# Patient Record
Sex: Female | Born: 1955
Health system: Southern US, Community
[De-identification: ages and names within clinical notes are randomized; demographics above are authoritative.]

## PROBLEM LIST (undated history)

## (undated) DIAGNOSIS — E559 Vitamin D deficiency, unspecified: Secondary | ICD-10-CM

## (undated) DIAGNOSIS — E669 Obesity, unspecified: Secondary | ICD-10-CM

## (undated) DIAGNOSIS — G4733 Obstructive sleep apnea (adult) (pediatric): Secondary | ICD-10-CM

## (undated) DIAGNOSIS — R55 Syncope and collapse: Secondary | ICD-10-CM

## (undated) DIAGNOSIS — R32 Unspecified urinary incontinence: Secondary | ICD-10-CM

## (undated) DIAGNOSIS — I471 Supraventricular tachycardia, unspecified: Secondary | ICD-10-CM

## (undated) DIAGNOSIS — I340 Nonrheumatic mitral (valve) insufficiency: Secondary | ICD-10-CM

## (undated) DIAGNOSIS — I10 Essential (primary) hypertension: Secondary | ICD-10-CM

## (undated) DIAGNOSIS — D649 Anemia, unspecified: Secondary | ICD-10-CM

## (undated) DIAGNOSIS — D509 Iron deficiency anemia, unspecified: Secondary | ICD-10-CM

## (undated) HISTORY — PX: TUBAL LIGATION: SHX77

## (undated) HISTORY — DX: Nonrheumatic mitral (valve) insufficiency: I34.0

## (undated) HISTORY — PX: COLONOSCOPY WITH PROPOFOL: SHX5780

## (undated) HISTORY — DX: Obstructive sleep apnea (adult) (pediatric): G47.33

## (undated) HISTORY — DX: Iron deficiency anemia, unspecified: D50.9

## (undated) HISTORY — DX: Syncope and collapse: R55

## (undated) HISTORY — DX: Supraventricular tachycardia, unspecified: I47.10

## (undated) HISTORY — DX: Unspecified urinary incontinence: R32

---

## 1999-06-26 ENCOUNTER — Encounter: Payer: Self-pay | Admitting: Gynecology

## 1999-06-26 ENCOUNTER — Encounter: Admission: RE | Admit: 1999-06-26 | Discharge: 1999-06-26 | Payer: Self-pay | Admitting: *Deleted

## 2001-01-06 ENCOUNTER — Encounter: Payer: Self-pay | Admitting: Gynecology

## 2001-01-06 ENCOUNTER — Encounter: Admission: RE | Admit: 2001-01-06 | Discharge: 2001-01-06 | Payer: Self-pay | Admitting: *Deleted

## 2004-06-16 ENCOUNTER — Ambulatory Visit: Payer: Self-pay

## 2005-11-14 ENCOUNTER — Encounter: Admission: RE | Admit: 2005-11-14 | Discharge: 2005-11-14 | Payer: Self-pay

## 2006-07-04 ENCOUNTER — Ambulatory Visit: Payer: Self-pay

## 2007-07-24 ENCOUNTER — Ambulatory Visit: Payer: Self-pay

## 2007-09-02 ENCOUNTER — Ambulatory Visit: Payer: Self-pay | Admitting: Gastroenterology

## 2008-08-26 ENCOUNTER — Ambulatory Visit: Payer: Self-pay

## 2009-09-06 ENCOUNTER — Ambulatory Visit: Payer: Self-pay

## 2009-10-05 ENCOUNTER — Ambulatory Visit: Payer: Self-pay

## 2009-11-04 ENCOUNTER — Ambulatory Visit: Payer: Self-pay

## 2010-06-23 ENCOUNTER — Ambulatory Visit: Payer: Self-pay

## 2011-03-07 ENCOUNTER — Ambulatory Visit: Payer: Self-pay | Admitting: Internal Medicine

## 2011-04-13 ENCOUNTER — Ambulatory Visit: Payer: Self-pay | Admitting: Internal Medicine

## 2012-03-18 ENCOUNTER — Ambulatory Visit: Payer: Self-pay | Admitting: Internal Medicine

## 2013-04-07 ENCOUNTER — Ambulatory Visit: Payer: Self-pay | Admitting: Internal Medicine

## 2013-04-17 ENCOUNTER — Ambulatory Visit: Payer: Self-pay | Admitting: Gastroenterology

## 2014-05-13 ENCOUNTER — Ambulatory Visit: Payer: Self-pay | Admitting: Internal Medicine

## 2015-05-11 ENCOUNTER — Other Ambulatory Visit: Payer: Self-pay | Admitting: Internal Medicine

## 2015-05-11 DIAGNOSIS — Z1231 Encounter for screening mammogram for malignant neoplasm of breast: Secondary | ICD-10-CM

## 2015-05-23 ENCOUNTER — Ambulatory Visit
Admission: RE | Admit: 2015-05-23 | Discharge: 2015-05-23 | Disposition: A | Discharge status: Home / Self Care | Payer: 59 | Source: Ambulatory Visit | Attending: Internal Medicine | Admitting: Internal Medicine

## 2015-05-23 DIAGNOSIS — Z1231 Encounter for screening mammogram for malignant neoplasm of breast: Secondary | ICD-10-CM

## 2016-05-02 ENCOUNTER — Other Ambulatory Visit: Payer: Self-pay | Admitting: Internal Medicine

## 2016-05-02 DIAGNOSIS — Z1231 Encounter for screening mammogram for malignant neoplasm of breast: Secondary | ICD-10-CM

## 2016-05-24 ENCOUNTER — Ambulatory Visit
Admission: RE | Admit: 2016-05-24 | Discharge: 2016-05-24 | Disposition: A | Discharge status: Home / Self Care | Payer: 59 | Source: Ambulatory Visit | Attending: Internal Medicine | Admitting: Internal Medicine

## 2016-05-24 DIAGNOSIS — Z1231 Encounter for screening mammogram for malignant neoplasm of breast: Secondary | ICD-10-CM | POA: Insufficient documentation

## 2016-08-08 DIAGNOSIS — R8290 Unspecified abnormal findings in urine: Secondary | ICD-10-CM | POA: Diagnosis not present

## 2016-08-08 DIAGNOSIS — D508 Other iron deficiency anemias: Secondary | ICD-10-CM | POA: Diagnosis not present

## 2016-08-08 DIAGNOSIS — Z0000 Encounter for general adult medical examination without abnormal findings: Secondary | ICD-10-CM | POA: Diagnosis not present

## 2016-09-18 DIAGNOSIS — E559 Vitamin D deficiency, unspecified: Secondary | ICD-10-CM | POA: Diagnosis not present

## 2016-09-18 DIAGNOSIS — Z79899 Other long term (current) drug therapy: Secondary | ICD-10-CM | POA: Diagnosis not present

## 2016-09-18 DIAGNOSIS — D508 Other iron deficiency anemias: Secondary | ICD-10-CM | POA: Diagnosis not present

## 2016-09-18 DIAGNOSIS — Z1329 Encounter for screening for other suspected endocrine disorder: Secondary | ICD-10-CM | POA: Diagnosis not present

## 2016-09-18 DIAGNOSIS — Z13220 Encounter for screening for lipoid disorders: Secondary | ICD-10-CM | POA: Diagnosis not present

## 2016-09-22 DIAGNOSIS — J069 Acute upper respiratory infection, unspecified: Secondary | ICD-10-CM | POA: Diagnosis not present

## 2016-10-05 ENCOUNTER — Ambulatory Visit: Payer: 59 | Admitting: Dietician

## 2016-10-22 ENCOUNTER — Encounter: Payer: 59 | Attending: Internal Medicine | Admitting: Dietician

## 2016-10-22 ENCOUNTER — Encounter: Payer: Self-pay | Admitting: Dietician

## 2016-10-22 VITALS — Ht 62.0 in | Wt 297.1 lb

## 2016-10-22 DIAGNOSIS — Z713 Dietary counseling and surveillance: Secondary | ICD-10-CM | POA: Insufficient documentation

## 2016-10-22 DIAGNOSIS — E669 Obesity, unspecified: Secondary | ICD-10-CM | POA: Diagnosis not present

## 2016-10-22 DIAGNOSIS — Z6841 Body Mass Index (BMI) 40.0 and over, adult: Secondary | ICD-10-CM | POA: Insufficient documentation

## 2016-10-22 DIAGNOSIS — IMO0001 Reserved for inherently not codable concepts without codable children: Secondary | ICD-10-CM

## 2016-10-22 DIAGNOSIS — D508 Other iron deficiency anemias: Secondary | ICD-10-CM | POA: Insufficient documentation

## 2016-10-22 DIAGNOSIS — K635 Polyp of colon: Secondary | ICD-10-CM | POA: Insufficient documentation

## 2016-10-22 DIAGNOSIS — E559 Vitamin D deficiency, unspecified: Secondary | ICD-10-CM | POA: Insufficient documentation

## 2016-10-22 NOTE — Progress Notes (Signed)
Medical Nutrition Therapy: Visit start time: 9390  end time: 1150  Assessment:  Diagnosis: obesity Past medical history: colon polyp, iron deficiency anemia, Vitamin D deficiency Psychosocial issues/ stress concerns: none Preferred learning method:  . Auditory  Current weight: 297.1lbs with shoes  Height: 5'2" Medications, supplements: reconciled list in medical record  Progress and evaluation: Patient reports recent weight loss of about 5lbs since working to increase general physical activity. Was participating in weight watchers at work, which helped for a while, but patient reports tiring of keeping up with points. She would like a sustainable plan for healthy lifestyle changes.    Physical activity: Increasing ADL's by taking stairs at work (4 flights) and parking farther away from buidlings, etc.   Dietary Intake:  Usual eating pattern includes 2 meals and 1-2 snacks per day. Dining out frequency: 9 meals per week.  Breakfast: activia yogurt; unsweet tea; sleeps after working 3rd shift Snack: none Lunch: 12-1pm sandwich or leftovers from previous evening meal.  Snack: none Supper: 7pm stir fry, meat loaf, fish-cod, flounder; Chinese fried rice with meat and veg.  Snack: sleeps after dinner about 2hrs, then goes to work. 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  Beverages: water, unsweet tea  Nutrition Care Education: Topics covered: weight management Basic nutrition: basic food groups, appropriate nutrient balance, appropriate meal and snack schedule, general nutrition guidelines    Weight control: benefits of weight control, role of stress hormones on weight; basic meal planning for about 1400kcal daily intake. Encouraged high fiber foods to promote fulness, and eating small portions of balanced nutrition at regular intervals. Discussed various quick and light options for meals and snacks.   Nutritional Diagnosis:  [place]-3.3 Overweight/obesity As  related to excess calories, stress, inactivity.  As evidenced by BMI 54, patient report.  Intervention: Instruction as noted above.   Set goals with direction from patient; she will first work to increase vegetable intake.   Commended her for finding ways to increase physical activity.   Education Materials given:  . Food lists/ Planning A Balanced Meal . Sample meal pattern/ menus . Goals/ instructions  Learner/ who was taught:  . Patient   Level of understanding: Marland Kitchen Verbalizes/ demonstrates competency  Demonstrated degree of understanding via:   Teach back Learning barriers: . None  Willingness to learn/ readiness for change: . Eager, change in progress  Monitoring and Evaluation:  Dietary intake, exercise, and body weight      follow up: 11/19/16

## 2016-10-22 NOTE — Patient Instructions (Signed)
   Eat a snack during work shift that includes some healthy carb and protein, such as a small sandwich, or fruit with nuts or lowfat cheese, veggies with cheese, bean and vegetable blend.   Have a yogurt or other small snack before going to sleep in the mornings.   Include generous portion of veggies and/or  with lunch and dinner meals to get full with low-calorie foods, to help with keeping blood pressure and cholesterol in good control.   Continue to build up physical activity in your day by adding opportunities for exercise both at work and home.

## 2016-11-19 ENCOUNTER — Ambulatory Visit: Payer: 59 | Admitting: Dietician

## 2016-12-14 ENCOUNTER — Encounter: Payer: Self-pay | Admitting: Dietician

## 2016-12-14 NOTE — Progress Notes (Signed)
Patient has not yet called back to reschedule her cancelled appointment. Sent discharge letter to MD.

## 2017-01-08 DIAGNOSIS — R300 Dysuria: Secondary | ICD-10-CM | POA: Diagnosis not present

## 2017-01-08 DIAGNOSIS — R8290 Unspecified abnormal findings in urine: Secondary | ICD-10-CM | POA: Diagnosis not present

## 2017-04-02 ENCOUNTER — Other Ambulatory Visit: Payer: Self-pay | Admitting: Obstetrics & Gynecology

## 2017-04-02 DIAGNOSIS — Z01419 Encounter for gynecological examination (general) (routine) without abnormal findings: Secondary | ICD-10-CM | POA: Diagnosis not present

## 2017-04-02 DIAGNOSIS — Z1211 Encounter for screening for malignant neoplasm of colon: Secondary | ICD-10-CM | POA: Diagnosis not present

## 2017-04-02 DIAGNOSIS — Z1231 Encounter for screening mammogram for malignant neoplasm of breast: Secondary | ICD-10-CM

## 2017-04-11 DIAGNOSIS — E559 Vitamin D deficiency, unspecified: Secondary | ICD-10-CM | POA: Diagnosis not present

## 2017-04-11 DIAGNOSIS — I10 Essential (primary) hypertension: Secondary | ICD-10-CM | POA: Diagnosis not present

## 2017-05-27 ENCOUNTER — Ambulatory Visit
Admission: RE | Admit: 2017-05-27 | Discharge: 2017-05-27 | Disposition: A | Discharge status: Home / Self Care | Payer: 59 | Source: Ambulatory Visit | Attending: Obstetrics & Gynecology | Admitting: Obstetrics & Gynecology

## 2017-05-27 DIAGNOSIS — Z1231 Encounter for screening mammogram for malignant neoplasm of breast: Secondary | ICD-10-CM

## 2018-04-25 DIAGNOSIS — E559 Vitamin D deficiency, unspecified: Secondary | ICD-10-CM | POA: Diagnosis not present

## 2018-04-25 DIAGNOSIS — Z79899 Other long term (current) drug therapy: Secondary | ICD-10-CM | POA: Diagnosis not present

## 2018-04-25 DIAGNOSIS — I10 Essential (primary) hypertension: Secondary | ICD-10-CM | POA: Diagnosis not present

## 2018-04-25 DIAGNOSIS — Z6841 Body Mass Index (BMI) 40.0 and over, adult: Secondary | ICD-10-CM | POA: Diagnosis not present

## 2018-05-13 DIAGNOSIS — Z23 Encounter for immunization: Secondary | ICD-10-CM | POA: Diagnosis not present

## 2018-05-13 DIAGNOSIS — Z1211 Encounter for screening for malignant neoplasm of colon: Secondary | ICD-10-CM | POA: Diagnosis not present

## 2018-05-13 DIAGNOSIS — Z8371 Family history of colonic polyps: Secondary | ICD-10-CM | POA: Diagnosis not present

## 2018-05-13 DIAGNOSIS — Z01818 Encounter for other preprocedural examination: Secondary | ICD-10-CM | POA: Diagnosis not present

## 2018-06-25 ENCOUNTER — Ambulatory Visit: Admit: 2018-06-25 | Payer: 59 | Admitting: Internal Medicine

## 2018-06-25 SURGERY — COLONOSCOPY WITH PROPOFOL
Anesthesia: General

## 2018-09-22 DIAGNOSIS — R399 Unspecified symptoms and signs involving the genitourinary system: Secondary | ICD-10-CM | POA: Diagnosis not present

## 2019-04-23 ENCOUNTER — Other Ambulatory Visit: Payer: Self-pay | Admitting: Internal Medicine

## 2019-04-23 DIAGNOSIS — M7989 Other specified soft tissue disorders: Secondary | ICD-10-CM

## 2019-04-30 ENCOUNTER — Other Ambulatory Visit: Payer: Self-pay

## 2019-04-30 ENCOUNTER — Ambulatory Visit
Admission: RE | Admit: 2019-04-30 | Discharge: 2019-04-30 | Disposition: A | Discharge status: Home / Self Care | Payer: 59 | Source: Ambulatory Visit | Attending: Internal Medicine | Admitting: Internal Medicine

## 2019-04-30 DIAGNOSIS — M7989 Other specified soft tissue disorders: Secondary | ICD-10-CM | POA: Insufficient documentation

## 2019-07-20 ENCOUNTER — Ambulatory Visit: Payer: Self-pay | Admitting: Urology

## 2019-09-07 ENCOUNTER — Ambulatory Visit: Payer: Self-pay | Admitting: Urology

## 2019-09-23 DIAGNOSIS — U071 COVID-19: Secondary | ICD-10-CM | POA: Insufficient documentation

## 2019-09-28 ENCOUNTER — Ambulatory Visit: Payer: Self-pay | Admitting: Urology

## 2019-10-19 ENCOUNTER — Ambulatory Visit: Payer: Self-pay | Admitting: Urology

## 2019-10-26 ENCOUNTER — Other Ambulatory Visit: Payer: Self-pay

## 2019-10-26 ENCOUNTER — Encounter: Payer: Self-pay | Admitting: Urology

## 2019-10-26 ENCOUNTER — Ambulatory Visit: Payer: 59 | Admitting: Urology

## 2019-10-26 VITALS — BP 132/68 | HR 88 | Ht 62.0 in | Wt 290.0 lb

## 2019-10-26 DIAGNOSIS — N3941 Urge incontinence: Secondary | ICD-10-CM | POA: Diagnosis not present

## 2019-10-26 DIAGNOSIS — R32 Unspecified urinary incontinence: Secondary | ICD-10-CM

## 2019-10-26 LAB — URINALYSIS, COMPLETE
Bilirubin, UA: NEGATIVE
Glucose, UA: NEGATIVE
Ketones, UA: NEGATIVE
Leukocytes,UA: NEGATIVE
Nitrite, UA: NEGATIVE
Protein,UA: NEGATIVE
RBC, UA: NEGATIVE
Specific Gravity, UA: 1.02 (ref 1.005–1.030)
Urobilinogen, Ur: 0.2 mg/dL (ref 0.2–1.0)
pH, UA: 5.5 (ref 5.0–7.5)

## 2019-10-26 LAB — BLADDER SCAN AMB NON-IMAGING: Scan Result: 6

## 2019-10-26 LAB — MICROSCOPIC EXAMINATION
Bacteria, UA: NONE SEEN
RBC, Urine: NONE SEEN /hpf (ref 0–2)

## 2019-10-26 MED ORDER — MIRABEGRON ER 50 MG PO TB24: 50.0000 mg | ORAL_TABLET | Freq: Every day | ORAL | 11 refills | Status: DC

## 2019-10-26 NOTE — Progress Notes (Signed)
10/26/2019 9:15 AM   Jennifer Mcbride 1956-05-01 [id number]  Referring provider: Idelle Crouch, MD [place] [facility] [place],  [place] [address]  Chief Complaint  Patient presents with  . Urinary Incontinence    HPI: I was consulted to assess the patient's urinary incontinence.  She has urge incontinence wearing 9 pad a day moderately wet to soaked.  She voids every 2 hours during the day and night.  She denies stress incontinence.  She has moderately severe bedwetting  She denies a history of previous GU surgery kidney stones bladder infections and she is never smoked.  No neurologic issues.  No hysterectomy.  No treatment.  Bowel movements normal   PMH: No past medical history on file.  Surgical History: No past surgical history on file.  Home Medications:  Allergies as of 10/26/2019   No Known Allergies     Medication List       Accurate as of October 26, 2019  9:15 AM. If you have any questions, ask your nurse or doctor.        bisoprolol-hydrochlorothiazide 5-6.25 MG tablet Commonly known as: ZIAC Take 1 tablet by mouth daily.   Vitamin D (Ergocalciferol) 1.25 MG (50000 UNIT) Caps capsule Commonly known as: DRISDOL TAKE 1 CAPSULE BY MOUTH  WEEKLY       Allergies: No Known Allergies  Family History: Family History  Problem Relation Age of Onset  . Breast cancer Neg Hx     Social History:  reports that she has never smoked. She has never used smokeless tobacco. She reports that she does not drink alcohol. No history on file for drug.  ROS:                                        Physical Exam: BP 132/68   Pulse 88   Ht 5\' 2"  (1.575 m)   Wt 131.5 kg   BMI 53.04 kg/m   Constitutional:  Alert and oriented, No acute distress. HEENT: [place] AT, moist mucus membranes.  Trachea midline, no masses. Cardiovascular: No clubbing, cyanosis, or edema. Respiratory: Normal respiratory effort, no increased work of  breathing. GI: Abdomen is soft, nontender, nondistended, no abdominal masses GU: Well supported bladder neck and no stress incontinence or prolapse Skin: No rashes, bruises or suspicious lesions. Lymph: No cervical or inguinal adenopathy. Neurologic: Grossly intact, no focal deficits, moving all 4 extremities. Psychiatric: Normal mood and affect.  Laboratory Data: No results found for: WBC, HGB, HCT, MCV, PLT  No results found for: CREATININE  No results found for: PSA  No results found for: TESTOSTERONE  No results found for: HGBA1C  Urinalysis No results found for: COLORURINE, APPEARANCEUR, LABSPEC, [place], GLUCOSEU, HGBUR, BILIRUBINUR, KETONESUR, PROTEINUR, UROBILINOGEN, NITRITE, LEUKOCYTESUR  Pertinent Imaging:   Assessment & Plan: Patient has high-volume urge incontinence and bedwetting.  She has significant frequency nocturia.  I decided not to order urodynamics based upon the clinical presentation.  I did not recommend cystoscopy yet.  I will treat her with medication and probably percutaneous tibial nerve stimulation.  Based upon body habitus and obesity Botox would not be ideal and I would have to check her lower back evaluating the role of sacral nerve stimulation.  If she becomes refractory to treatment cystoscopy is reasonable  Reassess in 5 weeks on Myrbetriq samples and prescription    1. Urinary incontinence, unspecified type  -  Urinalysis, Complete   No follow-ups on file.  Reece Packer, MD  [facility] [address], [place], [place] [address] [phone]

## 2019-10-28 LAB — CULTURE, URINE COMPREHENSIVE

## 2019-11-24 ENCOUNTER — Other Ambulatory Visit: Payer: Self-pay | Admitting: Internal Medicine

## 2019-11-24 DIAGNOSIS — Z1231 Encounter for screening mammogram for malignant neoplasm of breast: Secondary | ICD-10-CM

## 2019-11-24 DIAGNOSIS — E7800 Pure hypercholesterolemia, unspecified: Secondary | ICD-10-CM | POA: Insufficient documentation

## 2019-11-30 ENCOUNTER — Ambulatory Visit: Payer: Self-pay | Admitting: Urology

## 2019-12-01 ENCOUNTER — Encounter: Payer: Self-pay | Admitting: Urology

## 2019-12-21 ENCOUNTER — Other Ambulatory Visit: Payer: Self-pay

## 2019-12-21 DIAGNOSIS — R32 Unspecified urinary incontinence: Secondary | ICD-10-CM

## 2019-12-21 MED ORDER — MIRABEGRON ER 50 MG PO TB24: 50.0000 mg | ORAL_TABLET | Freq: Every day | ORAL | 3 refills | Status: DC

## 2020-01-12 ENCOUNTER — Other Ambulatory Visit: Payer: Self-pay

## 2020-01-12 DIAGNOSIS — R32 Unspecified urinary incontinence: Secondary | ICD-10-CM

## 2020-01-12 MED ORDER — MIRABEGRON ER 50 MG PO TB24: 50.0000 mg | ORAL_TABLET | Freq: Every day | ORAL | 3 refills | Status: DC

## 2020-03-24 ENCOUNTER — Other Ambulatory Visit
Admission: RE | Admit: 2020-03-24 | Discharge: 2020-03-24 | Disposition: A | Discharge status: Home / Self Care | Payer: 59 | Source: Ambulatory Visit | Attending: Gastroenterology | Admitting: Gastroenterology

## 2020-03-24 ENCOUNTER — Other Ambulatory Visit: Payer: Self-pay

## 2020-03-24 DIAGNOSIS — Z01812 Encounter for preprocedural laboratory examination: Secondary | ICD-10-CM | POA: Diagnosis not present

## 2020-03-24 DIAGNOSIS — Z20822 Contact with and (suspected) exposure to covid-19: Secondary | ICD-10-CM | POA: Diagnosis not present

## 2020-03-25 ENCOUNTER — Encounter: Payer: Self-pay | Admitting: *Deleted

## 2020-03-25 LAB — SARS CORONAVIRUS 2 (TAT 6-24 HRS): SARS Coronavirus 2: NEGATIVE

## 2020-03-28 ENCOUNTER — Ambulatory Visit: Payer: 59 | Admitting: Anesthesiology

## 2020-03-28 ENCOUNTER — Other Ambulatory Visit: Payer: Self-pay

## 2020-03-28 ENCOUNTER — Ambulatory Visit
Admission: RE | Admit: 2020-03-28 | Discharge: 2020-03-28 | Disposition: A | Discharge status: Home / Self Care | Payer: 59 | Attending: Gastroenterology | Admitting: Gastroenterology

## 2020-03-28 ENCOUNTER — Encounter
Admission: RE | Disposition: A | Discharge status: Home / Self Care | Payer: Self-pay | Source: Home / Self Care | Attending: Gastroenterology

## 2020-03-28 DIAGNOSIS — Z1211 Encounter for screening for malignant neoplasm of colon: Secondary | ICD-10-CM | POA: Diagnosis present

## 2020-03-28 DIAGNOSIS — K640 First degree hemorrhoids: Secondary | ICD-10-CM | POA: Insufficient documentation

## 2020-03-28 DIAGNOSIS — I10 Essential (primary) hypertension: Secondary | ICD-10-CM | POA: Diagnosis not present

## 2020-03-28 DIAGNOSIS — Z8371 Family history of colonic polyps: Secondary | ICD-10-CM | POA: Insufficient documentation

## 2020-03-28 DIAGNOSIS — E669 Obesity, unspecified: Secondary | ICD-10-CM | POA: Diagnosis not present

## 2020-03-28 DIAGNOSIS — E559 Vitamin D deficiency, unspecified: Secondary | ICD-10-CM | POA: Insufficient documentation

## 2020-03-28 DIAGNOSIS — Z79899 Other long term (current) drug therapy: Secondary | ICD-10-CM | POA: Insufficient documentation

## 2020-03-28 DIAGNOSIS — K5730 Diverticulosis of large intestine without perforation or abscess without bleeding: Secondary | ICD-10-CM | POA: Diagnosis not present

## 2020-03-28 DIAGNOSIS — Z6841 Body Mass Index (BMI) 40.0 and over, adult: Secondary | ICD-10-CM | POA: Insufficient documentation

## 2020-03-28 HISTORY — PX: COLONOSCOPY WITH PROPOFOL: SHX5780

## 2020-03-28 HISTORY — DX: Essential (primary) hypertension: I10

## 2020-03-28 HISTORY — DX: Vitamin D deficiency, unspecified: E55.9

## 2020-03-28 HISTORY — DX: Obesity, unspecified: E66.9

## 2020-03-28 HISTORY — DX: Anemia, unspecified: D64.9

## 2020-03-28 SURGERY — COLONOSCOPY WITH PROPOFOL
Anesthesia: General

## 2020-03-28 MED ORDER — PROPOFOL 500 MG/50ML IV EMUL
INTRAVENOUS | Status: DC | PRN
  Administered 2020-03-28: 125 ug/kg/min via INTRAVENOUS

## 2020-03-28 MED ORDER — EPHEDRINE 5 MG/ML INJ
INTRAVENOUS | Status: AC
  Filled 2020-03-28: qty 10

## 2020-03-28 MED ORDER — PROPOFOL 500 MG/50ML IV EMUL
INTRAVENOUS | Status: AC
  Filled 2020-03-28: qty 50

## 2020-03-28 MED ORDER — LIDOCAINE HCL (CARDIAC) PF 100 MG/5ML IV SOSY
PREFILLED_SYRINGE | INTRAVENOUS | Status: DC | PRN
  Administered 2020-03-28: 30 mg via INTRAVENOUS

## 2020-03-28 MED ORDER — GLYCOPYRROLATE 0.2 MG/ML IJ SOLN
INTRAMUSCULAR | Status: DC | PRN
  Administered 2020-03-28: .2 mg via INTRAVENOUS

## 2020-03-28 MED ORDER — GLYCOPYRROLATE 0.2 MG/ML IJ SOLN
INTRAMUSCULAR | Status: AC
  Filled 2020-03-28: qty 1

## 2020-03-28 MED ORDER — PROPOFOL 10 MG/ML IV BOLUS
INTRAVENOUS | Status: DC | PRN
  Administered 2020-03-28 (×3): 50 mg via INTRAVENOUS

## 2020-03-28 MED ORDER — SODIUM CHLORIDE 0.9 % IV SOLN
INTRAVENOUS | Status: DC
  Administered 2020-03-28: 20 mL/h via INTRAVENOUS

## 2020-03-28 MED ORDER — LIDOCAINE HCL (PF) 2 % IJ SOLN
INTRAMUSCULAR | Status: AC
  Filled 2020-03-28: qty 5

## 2020-03-28 MED ORDER — EPHEDRINE SULFATE 50 MG/ML IJ SOLN
INTRAMUSCULAR | Status: DC | PRN
  Administered 2020-03-28: 5 mg via INTRAVENOUS

## 2020-03-28 NOTE — H&P (Signed)
Outpatient short stay form Pre-procedure 03/28/2020 8:07 AM Jennifer Miyamoto MD, MPH  Primary Physician: Dr. Doy Hutching  Reason for visit:  Screening  History of present illness:   Ms. Jennifer Mcbride is a 64 y/o lady with reported history of poylps in mother and sister. She has not had polyps before that she knows of. No abdominal surgeries. No family history of GI malignancies. No blood thinners.    Current Facility-Administered Medications:  .  0.9 %  sodium chloride infusion, , Intravenous, Continuous, Tiki Tucciarone, Hilton Cork, MD, Last Rate: 20 mL/hr at 03/28/20 0748, 20 mL/hr at 03/28/20 0748  Medications Prior to Admission  Medication Sig Dispense Refill Last Dose  . bisoprolol-hydrochlorothiazide (ZIAC) 5-6.25 MG tablet Take 1 tablet by mouth daily.   03/27/2020 at Unknown time  . mirabegron ER (MYRBETRIQ) 50 MG TB24 tablet Take 1 tablet (50 mg total) by mouth daily. 90 tablet 3 03/27/2020 at Unknown time  . Multiple Vitamin (MULTIVITAMIN) tablet Take 1 tablet by mouth daily.   Past Week at Unknown time  . Vitamin D, Ergocalciferol, (DRISDOL) 50000 units CAPS capsule TAKE 1 CAPSULE BY MOUTH  WEEKLY   Past Week at Unknown time     No Known Allergies   Past Medical History:  Diagnosis Date  . Anemia    IDA  . Hypertension   . Obesity   . Vitamin D deficiency     Review of systems:  Otherwise negative.    Physical Exam  Gen: Alert, oriented. Appears stated age.  HEENT: [place]/AT. PERRLA. Lungs: No respiratory distress Abd: soft, benign, no masses. BS+ Ext: No edema.     Planned procedures: Proceed with colonoscopy. The patient understands the nature of the planned procedure, indications, risks, alternatives and potential complications including but not limited to bleeding, infection, perforation, damage to internal organs and possible oversedation/side effects from anesthesia. The patient agrees and gives consent to proceed.  Please refer to procedure notes for findings,  recommendations and patient disposition/instructions.     Jennifer Miyamoto MD, MPH Gastroenterology 03/28/2020  8:07 AM

## 2020-03-28 NOTE — Op Note (Signed)
[facility] Gastroenterology Patient Name: Jennifer Mcbride Procedure Date: 03/28/2020 8:54 AM MRN: [id number] Account #: [id number] Date of Birth: 09-03-1955 Admit Type: Outpatient Age: 64 Room: [facility] ENDO ROOM 2 Gender: Female Note Status: Finalized Procedure:             Colonoscopy Indications:           Family history of colonic polyps in a first-degree                         relative Providers:             Andrey Farmer MD, MD Medicines:             Monitored Anesthesia Care Complications:         No immediate complications. Procedure:             Pre-Anesthesia Assessment:                        - Prior to the procedure, a History and Physical was                         performed, and patient medications and allergies were                         reviewed. The patient is competent. The risks and                         benefits of the procedure and the sedation options and                         risks were discussed with the patient. All questions                         were answered and informed consent was obtained.                         Patient identification and proposed procedure were                         verified by the physician, the nurse, the anesthetist                         and the technician in the endoscopy suite. Mental                         Status Examination: alert and oriented. Airway                         Examination: normal oropharyngeal airway and neck                         mobility. Respiratory Examination: clear to                         auscultation. CV Examination: normal. Prophylactic                         Antibiotics: The patient does not require prophylactic  antibiotics. Prior Anticoagulants: The patient has                         taken no previous anticoagulant or antiplatelet                         agents. ASA Grade Assessment: III - A patient with                         severe  systemic disease. After reviewing the risks and                         benefits, the patient was deemed in satisfactory                         condition to undergo the procedure. The anesthesia                         plan was to use monitored anesthesia care (MAC).                         Immediately prior to administration of medications,                         the patient was re-assessed for adequacy to receive                         sedatives. The heart rate, respiratory rate, oxygen                         saturations, blood pressure, adequacy of pulmonary                         ventilation, and response to care were monitored                         throughout the procedure. The physical status of the                         patient was re-assessed after the procedure.                        After obtaining informed consent, the colonoscope was                         passed under direct vision. Throughout the procedure,                         the patient's blood pressure, pulse, and oxygen                         saturations were monitored continuously. The                         Colonoscope was introduced through the anus and                         advanced to the the cecum, identified by appendiceal  orifice and ileocecal valve. The colonoscopy was                         performed without difficulty. The patient tolerated                         the procedure well. The quality of the bowel                         preparation was good. Findings:      The perianal and digital rectal examinations were normal.      A few small-mouthed diverticula were found in the sigmoid colon,       descending colon, transverse colon and ascending colon.      Non-bleeding internal hemorrhoids were found during retroflexion. The       hemorrhoids were Grade I (internal hemorrhoids that do not prolapse).      The exam was otherwise without abnormality on direct and  retroflexion       views. Impression:            - Diverticulosis in the sigmoid colon, in the                         descending colon, in the transverse colon and in the                         ascending colon.                        - Non-bleeding internal hemorrhoids.                        - The examination was otherwise normal on direct and                         retroflexion views.                        - No specimens collected. Recommendation:        - Discharge patient to home.                        - Resume previous diet.                        - Continue present medications.                        - Repeat colonoscopy in 10 years for surveillance.                        - Return to referring physician as previously                         scheduled. Procedure Code(s):     --- Professional ---                        [phone], Colonoscopy, flexible; diagnostic, including                         collection of specimen(s) by brushing  or washing, when                         performed (separate procedure) Diagnosis Code(s):     --- Professional ---                        K64.0, First degree hemorrhoids                        Z83.71, Family history of colonic polyps                        K57.30, Diverticulosis of large intestine without                         perforation or abscess without bleeding CPT copyright 2019 American Medical Association. All rights reserved. The codes documented in this report are preliminary and upon coder review may  be revised to meet current compliance requirements. Andrey Farmer, MD Andrey Farmer MD, MD 03/28/2020 9:29:32 AM Number of Addenda: 0 Note Initiated On: 03/28/2020 8:54 AM Scope Withdrawal Time: 0 hours 9 minutes 31 seconds  Total Procedure Duration: 0 hours 14 minutes 36 seconds  Estimated Blood Loss:  Estimated blood loss: none.      [facility]

## 2020-03-28 NOTE — Anesthesia Postprocedure Evaluation (Signed)
Anesthesia Post Note  Patient: Jennifer Mcbride  Procedure(s) Performed: COLONOSCOPY WITH PROPOFOL (N/A )  Patient location during evaluation: PACU Anesthesia Type: General Level of consciousness: awake and alert Pain management: pain level controlled Vital Signs Assessment: post-procedure vital signs reviewed and stable Respiratory status: spontaneous breathing, nonlabored ventilation, respiratory function stable and patient connected to nasal cannula oxygen Cardiovascular status: blood pressure returned to baseline and stable Postop Assessment: no apparent nausea or vomiting Anesthetic complications: no   No complications documented.   Last Vitals:  Vitals:   03/28/20 0740 03/28/20 0930  BP: 135/82 (!) 98/52  Pulse: 67   Resp: 20   Temp: (!) 35.9 C (!) 35.8 C  SpO2: 99%     Last Pain:  Vitals:   03/28/20 0930  TempSrc: Temporal  PainSc: [name]

## 2020-03-28 NOTE — Interval H&P Note (Signed)
History and Physical Interval Note:  03/28/2020 8:59 AM  Jennifer Mcbride  has presented today for surgery, with the diagnosis of FH POLYPS.  The various methods of treatment have been discussed with the patient and family. After consideration of risks, benefits and other options for treatment, the patient has consented to  Procedure(s): COLONOSCOPY WITH PROPOFOL (N/A) as a surgical intervention.  The patient's history has been reviewed, patient examined, no change in status, stable for surgery.  I have reviewed the patient's chart and labs.  Questions were answered to the patient's satisfaction.     [name]  Ok to proceed with colonoscopy.

## 2020-03-28 NOTE — Progress Notes (Signed)
   03/28/20 0745  Clinical Encounter Type  Visited With Family  Visit Type Initial  Referral From Chaplain  Consult/Referral To Chaplain  While rounding SDS on 8/23, chaplain briefly visit with patient's mother. Chaplain told mother she was checking to make sure she was fine and to see if she had any questions. Patient's mother said she is fine and chaplain wihsed her well.

## 2020-03-28 NOTE — Interval H&P Note (Signed)
History and Physical Interval Note:  03/28/2020 8:09 AM  Jennifer Mcbride  has presented today for surgery, with the diagnosis of FH POLYPS.  The various methods of treatment have been discussed with the patient and family. After consideration of risks, benefits and other options for treatment, the patient has consented to  Procedure(s): COLONOSCOPY WITH PROPOFOL (N/A) as a surgical intervention.  The patient's history has been reviewed, patient examined, no change in status, stable for surgery.  I have reviewed the patient's chart and labs.  Questions were answered to the patient's satisfaction.     [name]  Ok to proceed with colonoscopy.

## 2020-03-28 NOTE — Transfer of Care (Signed)
Immediate Anesthesia Transfer of Care Note  Patient: Jennifer Mcbride  Procedure(s) Performed: COLONOSCOPY WITH PROPOFOL (N/A )  Patient Location: PACU and Endoscopy Unit  Anesthesia Type:MAC  Level of Consciousness: awake, alert  and oriented  Airway & Oxygen Therapy: Patient Spontanous Breathing  Post-op Assessment: Report given to RN and Post -op Vital signs reviewed and stable  Post vital signs: Reviewed and stable  Last Vitals:  Vitals Value Taken Time  BP 98/52 03/28/20 0931  Temp 35.8 C 03/28/20 0930  Pulse 76 03/28/20 0932  Resp 23 03/28/20 0932  SpO2 93 % 03/28/20 0932  Vitals shown include unvalidated device data.  Last Pain:  Vitals:   03/28/20 0930  TempSrc: Temporal  PainSc: Asleep         Complications: No complications documented.

## 2020-03-28 NOTE — Anesthesia Preprocedure Evaluation (Signed)
Anesthesia Evaluation  Patient identified by MRN, date of birth, ID band Patient awake    Reviewed: Allergy & Precautions, H&P , NPO status , Patient's Chart, lab work & pertinent test results, reviewed documented beta blocker date and time   Airway Mallampati: II   Neck ROM: full    Dental  (+) Poor Dentition   Pulmonary neg pulmonary ROS,    Pulmonary exam normal        Cardiovascular Exercise Tolerance: Good hypertension, On Medications negative cardio ROS Normal cardiovascular exam Rhythm:regular Rate:Normal     Neuro/Psych negative neurological ROS  negative psych ROS   GI/Hepatic negative GI ROS, Neg liver ROS,   Endo/Other  Morbid obesity  Renal/GU negative Renal ROS  negative genitourinary   Musculoskeletal   Abdominal   Peds  Hematology  (+) Blood dyscrasia, anemia ,   Anesthesia Other Findings Past Medical History: No date: Anemia     Comment:  IDA No date: Hypertension No date: Obesity No date: Vitamin D deficiency Past Surgical History: No date: COLONOSCOPY WITH PROPOFOL No date: TUBAL LIGATION BMI    Body Mass Index: 53.04 kg/m     Reproductive/Obstetrics negative OB ROS                             Anesthesia Physical Anesthesia Plan  ASA: III  Anesthesia Plan: General   Post-op Pain Management:    Induction:   PONV Risk Score and Plan:   Airway Management Planned:   Additional Equipment:   Intra-op Plan:   Post-operative Plan:   Informed Consent: I have reviewed the patients History and Physical, chart, labs and discussed the procedure including the risks, benefits and alternatives for the proposed anesthesia with the patient or authorized representative who has indicated his/her understanding and acceptance.     Dental Advisory Given  Plan Discussed with: CRNA  Anesthesia Plan Comments:         Anesthesia Quick Evaluation

## 2020-03-30 ENCOUNTER — Encounter: Payer: Self-pay | Admitting: Gastroenterology

## 2020-04-04 ENCOUNTER — Other Ambulatory Visit: Payer: Self-pay

## 2020-04-04 ENCOUNTER — Ambulatory Visit
Admission: RE | Admit: 2020-04-04 | Discharge: 2020-04-04 | Disposition: A | Discharge status: Home / Self Care | Payer: 59 | Source: Ambulatory Visit | Attending: Internal Medicine | Admitting: Internal Medicine

## 2020-04-04 DIAGNOSIS — Z1231 Encounter for screening mammogram for malignant neoplasm of breast: Secondary | ICD-10-CM | POA: Insufficient documentation

## 2021-01-12 ENCOUNTER — Ambulatory Visit: Payer: 59 | Admitting: Adult Health

## 2021-01-17 ENCOUNTER — Ambulatory Visit: Payer: 59 | Admitting: Adult Health

## 2021-01-17 ENCOUNTER — Other Ambulatory Visit: Payer: Self-pay

## 2021-01-17 ENCOUNTER — Telehealth: Payer: Self-pay

## 2021-01-17 ENCOUNTER — Encounter: Payer: Self-pay | Admitting: Adult Health

## 2021-01-17 DIAGNOSIS — N393 Stress incontinence (female) (male): Secondary | ICD-10-CM | POA: Diagnosis not present

## 2021-01-17 DIAGNOSIS — Z6841 Body Mass Index (BMI) 40.0 and over, adult: Secondary | ICD-10-CM

## 2021-01-17 DIAGNOSIS — D259 Leiomyoma of uterus, unspecified: Secondary | ICD-10-CM | POA: Insufficient documentation

## 2021-01-17 DIAGNOSIS — Z0189 Encounter for other specified special examinations: Secondary | ICD-10-CM | POA: Insufficient documentation

## 2021-01-17 DIAGNOSIS — M7989 Other specified soft tissue disorders: Secondary | ICD-10-CM

## 2021-01-17 DIAGNOSIS — Z8371 Family history of colonic polyps: Secondary | ICD-10-CM | POA: Insufficient documentation

## 2021-01-17 DIAGNOSIS — G4733 Obstructive sleep apnea (adult) (pediatric): Secondary | ICD-10-CM | POA: Diagnosis not present

## 2021-01-17 DIAGNOSIS — E559 Vitamin D deficiency, unspecified: Secondary | ICD-10-CM

## 2021-01-17 DIAGNOSIS — Z1389 Encounter for screening for other disorder: Secondary | ICD-10-CM | POA: Diagnosis not present

## 2021-01-17 DIAGNOSIS — Z23 Encounter for immunization: Secondary | ICD-10-CM

## 2021-01-17 DIAGNOSIS — R32 Unspecified urinary incontinence: Secondary | ICD-10-CM

## 2021-01-17 LAB — SPECIMEN STATUS REPORT

## 2021-01-17 MED ORDER — MIRABEGRON ER 50 MG PO TB24: 50.0000 mg | ORAL_TABLET | Freq: Every day | ORAL | 1 refills | Status: DC

## 2021-01-17 NOTE — Patient Instructions (Signed)
Bjorn Loser, MD  Urology Urinary incontinence, unspecified type +1 more  Dx  [place] 3.2 21 Google reviews Urologist in [place], [place] COVID-19 info: [email or web address] Get online care: [email or web address] Address: [address], [place], [place] [address] Hours:  Open now    Add full hours Phone: [phone]  Health Maintenance, Female Adopting a healthy lifestyle and getting preventive care are important in promoting health and wellness. Ask your health care provider about: The right schedule for you to have regular tests and exams. Things you can do on your own to prevent diseases and keep yourself healthy. What should I know about diet, weight, and exercise? Eat a healthy diet  Eat a diet that includes plenty of vegetables, fruits, low-fat dairy products, and lean protein. Do not eat a lot of foods that are high in solid fats, added sugars, or sodium.  Maintain a healthy weight Body mass index (BMI) is used to identify weight problems. It estimates body fat based on height and weight. Your health care provider can help determineyour BMI and help you achieve or maintain a healthy weight. Get regular exercise Get regular exercise. This is one of the most important things you can do for your health. Most adults should: Exercise for at least 150 minutes each week. The exercise should increase your heart rate and make you sweat (moderate-intensity exercise). Do strengthening exercises at least twice a week. This is in addition to the moderate-intensity exercise. Spend less time sitting. Even light physical activity can be beneficial. Watch cholesterol and blood lipids Have your blood tested for lipids and cholesterol at 64 years of age, then havethis test every 5 years. Have your cholesterol levels checked more often if: Your lipid or cholesterol levels are high. You are older than 64 years of age. You are at high risk for heart  disease. What should I know about cancer screening? Depending on your health history and family history, you may need to have cancer screening at various ages. This may include screening for: Breast cancer. Cervical cancer. Colorectal cancer. Skin cancer. Lung cancer. What should I know about heart disease, diabetes, and high blood pressure? Blood pressure and heart disease High blood pressure causes heart disease and increases the risk of stroke. This is more likely to develop in people who have high blood pressure readings, are of African descent, or are overweight. Have your blood pressure checked: Every 3-5 years if you are 44-19 years of age. Every year if you are 66 years old or older. Diabetes Have regular diabetes screenings. This checks your fasting blood sugar level. Have the screening done: Once every three years after age 71 if you are at a normal weight and have a low risk for diabetes. More often and at a younger age if you are overweight or have a high risk for diabetes. What should I know about preventing infection? Hepatitis B If you have a higher risk for hepatitis B, you should be screened for this virus. Talk with your health care provider to find out if you are at risk forhepatitis B infection. Hepatitis C Testing is recommended for: Everyone born from [birth year] through 1965. Anyone with known risk factors for hepatitis C. Sexually transmitted infections (STIs) Get screened for STIs, including gonorrhea and chlamydia, if: You are sexually active and are younger than 64 years of age. You are older than 64 years of age and your health care provider tells you that you are at risk for this type  of infection. Your sexual activity has changed since you were last screened, and you are at increased risk for chlamydia or gonorrhea. Ask your health care provider if you are at risk. Ask your health care provider about whether you are at high risk for HIV. Your health care provider  may recommend a prescription medicine to help prevent HIV infection. If you choose to take medicine to prevent HIV, you should first get tested for HIV. You should then be tested every 3 months for as long as you are taking the medicine. Pregnancy If you are about to stop having your period (premenopausal) and you may become pregnant, seek counseling before you get pregnant. Take 400 to 800 micrograms (mcg) of folic acid every day if you become pregnant. Ask for birth control (contraception) if you want to prevent pregnancy. Osteoporosis and menopause Osteoporosis is a disease in which the bones lose minerals and strength with aging. This can result in bone fractures. If you are 26 years old or older, or if you are at risk for osteoporosis and fractures, ask your health care provider if you should: Be screened for bone loss. Take a calcium or vitamin D supplement to lower your risk of fractures. Be given hormone replacement therapy (HRT) to treat symptoms of menopause. Follow these instructions at home: Lifestyle Do not use any products that contain nicotine or tobacco, such as cigarettes, e-cigarettes, and chewing tobacco. If you need help quitting, ask your health care provider. Do not use street drugs. Do not share needles. Ask your health care provider for help if you need support or information about quitting drugs. Alcohol use Do not drink alcohol if: Your health care provider tells you not to drink. You are pregnant, may be pregnant, or are planning to become pregnant. If you drink alcohol: Limit how much you use to 0-1 drink a day. Limit intake if you are breastfeeding. Be aware of how much alcohol is in your drink. In the [place] one drink equals one 12 oz bottle of beer (355 mL), one 5 oz glass of wine (148 mL), or one 1 oz glass of hard liquor (44 mL). General instructions Schedule regular health, dental, and eye exams. Stay current with your vaccines. Tell your health care  provider if: You often feel depressed. You have ever been abused or do not feel safe at home. Summary Adopting a healthy lifestyle and getting preventive care are important in promoting health and wellness. Follow your health care provider's instructions about healthy diet, exercising, and getting tested or screened for diseases. Follow your health care provider's instructions on monitoring your cholesterol and blood pressure. This information is not intended to replace advice given to you by your health care provider. Make sure you discuss any questions you have with your healthcare provider. Document Revised: 07/16/2018 Document Reviewed: 07/16/2018 Elsevier Patient Education  2022 [place].    Recombinant Zoster (Shingles) Vaccine: What You Need to Know 1. Why get vaccinated? Recombinant zoster (shingles) vaccine can prevent shingles. Shingles (also called herpes zoster, or just zoster) is a painful skin rash, usually with blisters. In addition to the rash, shingles can cause fever, headache, chills, or upset stomach. More rarely, shingles can lead to pneumonia, hearingproblems, blindness, brain inflammation (encephalitis), or death. The most common complication of shingles is long-term nerve pain called postherpetic neuralgia (PHN). PHN occurs in the areas where the shingles rash was, even after the rash clears up. It can last for months or years after therash goes away.  The pain from PHN can be severe and debilitating. About 10 to 18% of people who get shingles will experience PHN. The risk of PHN increases with age. An older adult with shingles is more likely to develop PHN and have longer lasting and more severe pain than a younger person withshingles. Shingles is caused by the varicella zoster virus, the same virus that causes chickenpox. After you have chickenpox, the virus stays in your body and can cause shingles later in life. Shingles cannot be passed from one person to another,  but the virus that causes shingles can spread and cause chickenpox insomeone who had never had chickenpox or received chickenpox vaccine. 2. Recombinant shingles vaccine Recombinant shingles vaccine provides strong protection against shingles. Bypreventing shingles, recombinant shingles vaccine also protects against PHN. Recombinant shingles vaccine is the preferred vaccine for the prevention of shingles. However, a different vaccine, live shingles vaccine, may be used in somecircumstances. The recombinant shingles vaccine is recommended for adults 50 years and older without serious immune problems. It is given as a two-dose series. This vaccine is also recommended for people who have already gotten another type of shingles vaccine, the live shingles vaccine. There is no live virus inthis vaccine. Shingles vaccine may be given at the same time as other vaccines. 3. Talk with your health care provider Tell your vaccine provider if the person getting the vaccine: Has had an allergic reaction after a previous dose of recombinant shingles vaccine, or has any severe, life-threatening allergies. Is pregnant or breastfeeding. Is currently experiencing an episode of shingles. In some cases, your health care provider may decide to postpone shinglesvaccination to a future visit. People with minor illnesses, such as a cold, may be vaccinated. People who are moderately or severely ill should usually wait until they recover beforegetting recombinant shingles vaccine. Your health care provider can give you more information. 4. Risks of a vaccine reaction A sore arm with mild or moderate pain is very common after recombinant shingles vaccine, affecting about 80% of vaccinated people. Redness and swelling can also happen at the site of the injection. Tiredness, muscle pain, headache, shivering, fever, stomach pain, and nausea happen after vaccination in more than half of people who receive recombinant shingles  vaccine. In clinical trials, about 1 out of 6 people who got recombinant zoster vaccine experienced side effects that prevented them from doing regular activities.Symptoms usually went away on their own in 2 to 3 days. You should still get the second dose of recombinant zoster vaccine even if youhad one of these reactions after the first dose. People sometimes faint after medical procedures, including vaccination. Tellyour provider if you feel dizzy or have vision changes or ringing in the ears. As with any medicine, there is a very remote chance of a vaccine causing asevere allergic reaction, other serious injury, or death. 5. What if there is a serious problem? An allergic reaction could occur after the vaccinated person leaves the clinic. If you see signs of a severe allergic reaction (hives, swelling of the face and throat, difficulty breathing, a fast heartbeat, dizziness, or weakness), call 9-1-1 and get the person to the nearest hospital. For other signs that concern you, call your health care provider. Adverse reactions should be reported to the Vaccine Adverse Event Reporting System (VAERS). Your health care provider will usually file this report, or you can do it yourself. Visit the VAERS website at www.vaers.SamedayNews.es or call (830)653-8064. VAERS is only for reporting reactions, and VAERS staff do not  give medical advice. 6. How can I learn more? Ask your health care provider. Call your local or state health department. Contact the Centers for Disease Control and Prevention (CDC): Call 715-659-9488 (1-800-CDC-INFO) or Visit CDC's website at http://hunter.com/ Vaccine Information Statement Recombinant Zoster Vaccine (06/04/2018) This information is not intended to replace advice given to you by your health care provider. Make sure you discuss any questions you have with your healthcare provider. Document Revised: 03/25/2020 Document Reviewed: 03/25/2020 Elsevier Patient Education   [place].

## 2021-01-17 NOTE — Telephone Encounter (Signed)
Trasity called from [facility] to report stat labs. She reports that the D-Dimer Result reads 0.44

## 2021-01-17 NOTE — Progress Notes (Addendum)
New Patient Office Visit  Subjective:  Patient ID: Jennifer Mcbr[facility], female    DOB: [birth year]  Age: 65 y.o. MRN: [id number]  CC:  Chief Complaint  Patient presents with   New Patient (Initial Visit)    Pt C/O of swelling in her rt leg. Pt states previous PCP had test done to check for blood clots and pt states there were none . Pt also states she wants the Shingrix vaccine    HPI Jennifer Mcbride presents for new patient care.   She reports she has had continued swelling since her ultrasound and has had increased chronic swelling in right upper distal anterior thigh.    She is on Mybetriq, for stress incontinence has urologist. Jennifer [facility] urological Dr. Matilde Sprang.denies any changes with this and is taking medication as directed and is due for follow up with urology and encouraged to schedule.   History of right leg swelling, [place] she reports of was found to be one year ago as below.  IMPRESSION: 1. No evidence of right lower extremity deep venous thrombosis. 2. In the region of clinical swelling in the medial distal right thigh, the subcutaneous fat appears thicker and more prominent compared to the contralateral left thigh. This may be normal asymmetry. However, underlying component of lipoma may be present. A discretely marginated lipoma was not able to be demonstrated by ultrasound. Electronically Signed   By: Aletta Edouard M.D.   On: 04/30/2019 12:21 Past Medical History:  Diagnosis Date   Anemia    IDA   Hypertension    Obesity    Vitamin D deficiency     Past Surgical History:  Procedure Laterality Date   COLONOSCOPY WITH PROPOFOL     COLONOSCOPY WITH PROPOFOL N/A 03/28/2020   Procedure: COLONOSCOPY WITH PROPOFOL;  Surgeon: [name], MD;  Location: [facility] ENDOSCOPY;  Service: Endoscopy;  Laterality: N/A;   TUBAL LIGATION      Family History  Problem Relation Age of Onset   Breast cancer Neg Hx     Social History   Socioeconomic History    Marital status: Divorced    Spouse name: Not on file   Number of children: Not on file   Years of education: Not on file   Highest education level: Not on file  Occupational History   Not on file  Tobacco Use   Smoking status: Never   Smokeless tobacco: Never  Vaping Use   Vaping Use: Never used  Substance and Sexual Activity   Alcohol use: No   Drug use: Never   Sexual activity: Not on file  Other Topics Concern   Not on file  Social History Narrative   Not on file   Social Determinants of Health   Financial Resource Strain: Not on file  Food Insecurity: Not on file  Transportation Needs: Not on file  Physical Activity: Not on file  Stress: Not on file  Social Connections: Not on file  Intimate Partner Violence: Not on file    ROS Review of Systems  Constitutional: Negative.   HENT: Negative.    Eyes: Negative.   Respiratory: Negative.    Cardiovascular:  Negative for chest pain and palpitations. Leg swelling: right upper thigh.. Gastrointestinal: Negative.   Genitourinary: Negative.   Musculoskeletal: Negative.   Skin: Negative.   Neurological: Negative.   Psychiatric/Behavioral: Negative.     Objective:   Today's Vitals: BP 116/78   Pulse 76   Temp 98.3 F (36.8 C)   Ht  5' 2.01" (1.575 m)   Wt 296 lb 12.8 oz (134.6 kg)   SpO2 98%   BMI 54.27 kg/m   Physical Exam Vitals reviewed.  Constitutional:      General: She is not in acute distress.    Appearance: She is well-developed. She is obese. She is not ill-appearing, toxic-appearing or diaphoretic.     Interventions: She is not intubated. HENT:     Head: Normocephalic and atraumatic.     Right Ear: Tympanic membrane, ear canal and external ear normal.     Left Ear: Tympanic membrane, ear canal and external ear normal.     Nose: Nose normal.     Mouth/Throat:     Pharynx: No oropharyngeal exudate.  Eyes:     General: Lids are normal. No scleral icterus.       Right eye: No discharge.         Left eye: No discharge.     Conjunctiva/sclera: Conjunctivae normal.     Right eye: Right conjunctiva is not injected. No exudate or hemorrhage.    Left eye: Left conjunctiva is not injected. No exudate or hemorrhage.    Pupils: Pupils are equal, round, and reactive to light.  Neck:     Thyroid: No thyroid mass or thyromegaly.     Vascular: Normal carotid pulses. No carotid bruit, hepatojugular reflux or JVD.     Trachea: Trachea and phonation normal. No tracheal tenderness or tracheal deviation.     Meningeal: Brudzinski's sign and Kernig's sign absent.  Cardiovascular:     Rate and Rhythm: Normal rate and regular rhythm.     Pulses: Normal pulses.          Radial pulses are 2+ on the right side and 2+ on the left side.       Dorsalis pedis pulses are 2+ on the right side and 2+ on the left side.       Posterior tibial pulses are 2+ on the right side and 2+ on the left side.     Heart sounds: Normal heart sounds, S1 normal and S2 normal. Heart sounds not distant. No murmur heard.   No friction rub. No gallop.  Pulmonary:     Effort: Pulmonary effort is normal. No tachypnea, bradypnea, accessory muscle usage or respiratory distress. She is not intubated.     Breath sounds: Normal breath sounds. No stridor. No wheezing, rhonchi or rales.  Chest:     Chest wall: No tenderness.  Breasts:    Right: No supraclavicular adenopathy.     Left: No supraclavicular adenopathy.  Abdominal:     General: Bowel sounds are normal. There is no distension or abdominal bruit.     Palpations: Abdomen is soft. There is no shifting dullness, fluid wave, hepatomegaly, splenomegaly, mass or pulsatile mass.     Tenderness: There is no abdominal tenderness. There is no guarding or rebound.     Hernia: No hernia is present.  Musculoskeletal:        General: Swelling present. No tenderness or deformity. Normal range of motion.     Cervical back: Full passive range of motion without pain, normal range of motion  and neck supple. No edema, erythema or rigidity. No spinous process tenderness or muscular tenderness. Normal range of motion.     Right lower leg: Edema (anterior medial and anterior swelling, no warmth, is painful to touch.) present.     Left lower leg: Edema (1+ bilateral edema pedal.) present.  Legs:  Feet:     Right foot:     Skin integrity: Skin integrity normal.     Left foot:     Skin integrity: Skin integrity normal.  Lymphadenopathy:     Head:     Right side of head: No submental, submandibular, tonsillar, preauricular, posterior auricular or occipital adenopathy.     Left side of head: No submental, submandibular, tonsillar, preauricular, posterior auricular or occipital adenopathy.     Cervical: No cervical adenopathy.     Right cervical: No superficial, deep or posterior cervical adenopathy.    Left cervical: No superficial, deep or posterior cervical adenopathy.     Upper Body:     Right upper body: No supraclavicular or pectoral adenopathy.     Left upper body: No supraclavicular or pectoral adenopathy.  Skin:    General: Skin is warm and dry.     Coloration: Skin is not jaundiced or pale.     Findings: No abrasion, bruising, burn, ecchymosis, erythema, lesion, petechiae or rash.     Nails: There is no clubbing.  Neurological:     Mental Status: She is alert and oriented to person, place, and time.     GCS: GCS eye subscore is 4. GCS verbal subscore is 5. GCS motor subscore is 6.     Cranial Nerves: No cranial nerve deficit.     Sensory: No sensory deficit.     Motor: No weakness, tremor, atrophy, abnormal muscle tone or seizure activity.     Coordination: Coordination normal.     Gait: Gait normal.     Deep Tendon Reflexes: Reflexes are normal and symmetric. Reflexes normal. Babinski sign absent on the right side. Babinski sign absent on the left side.     Reflex Scores:      Tricep reflexes are 2+ on the right side and 2+ on the left side.      Bicep reflexes  are 2+ on the right side and 2+ on the left side.      Brachioradialis reflexes are 2+ on the right side and 2+ on the left side.      Patellar reflexes are 2+ on the right side and 2+ on the left side.      Achilles reflexes are 2+ on the right side and 2+ on the left side. Psychiatric:        Speech: Speech normal.        Behavior: Behavior normal.        Thought Content: Thought content normal.        Judgment: Judgment normal.    Assessment & Plan:   Problem List Items Addressed This Visit       Respiratory   OSA (obstructive sleep apnea)     Other   Vitamin D deficiency   Relevant Orders   VITAMIN D 25 Hydroxy (Vit-D Deficiency, Fractures)   Morbid obesity with BMI of 50.0-59.9, adult (HCC) - Primary   Relevant Orders   CBC with Differential/Platelet   Comprehensive metabolic panel   Lipid panel   TSH   Need for shingles vaccine   Right leg swelling   Relevant Orders   VAS [place] LOWER EXTREMITY VENOUS (DVT)   D-dimer, quantitative (not at [facility])   Urinary incontinence   Relevant Medications   mirabegron ER (MYRBETRIQ) 50 MG TB24 tablet   Other Relevant Orders   CULTURE, URINE COMPREHENSIVE   Hgb A1c w/o eAG   Urinalysis, Routine w reflex microscopic   Stress incontinence  Relevant Medications   mirabegron ER (MYRBETRIQ) 50 MG TB24 tablet   Screening for blood or protein in urine    Outpatient Encounter Medications as of 01/17/2021  Medication Sig   bisoprolol-hydrochlorothiazide (ZIAC) 5-6.25 MG tablet Take 1 tablet by mouth daily.   Multiple Vitamin (MULTIVITAMIN) tablet Take 1 tablet by mouth daily.   Vitamin D, Ergocalciferol, (DRISDOL) 50000 units CAPS capsule TAKE 1 CAPSULE BY MOUTH  WEEKLY   [DISCONTINUED] mirabegron ER (MYRBETRIQ) 50 MG TB24 tablet Take 1 tablet (50 mg total) by mouth daily.   mirabegron ER (MYRBETRIQ) 50 MG TB24 tablet Take 1 tablet (50 mg total) by mouth daily.   [DISCONTINUED] mirabegron ER (MYRBETRIQ) 50 MG TB24 tablet Take 1 tablet  (50 mg total) by mouth daily.   No facility-administered encounter medications on file as of 01/17/2021.   Refill sent to [facility] for stress incontinence medicine.   The patient is advised to follow a low fat, low cholesterol diet, attempt to lose weight, reduce exposure to stress, continue current medications, continue current healthy lifestyle patterns, and return for routine annual checkups   Will have vein and vascular do a ultrasound rule out DVT and see if lipoma or mass visualized on Ultrasound.   Ok to schedule  Shingrix series if she tolerates:  Pharmacologic Category Vaccine; Vaccine, Recombinant Dosing: Adult  Shingles prevention: Adults ?64 years of age (immunocompromised [current or future]): IM: 0.5 mL administered as a 2-dose series at 0 and 1 to 2 months. Adults ?63 years of age (immunocompetent): IM: 0.5 mL administered as a 2-dose series at 0 and 2 to 6 months.  Follow-up: Return in about 3 months (around 04/19/2021), or if symptoms worsen or fail to improve, for at any time for any worsening symptoms, Go to Emergency room/ urgent care if worse.   Marcille Buffy, FNP

## 2021-01-18 LAB — COMPREHENSIVE METABOLIC PANEL
ALT: 13 IU/L (ref 0–32)
AST: 14 IU/L (ref 0–40)
Albumin/Globulin Ratio: 1.4 (ref 1.2–2.2)
Albumin: 4.6 g/dL (ref 3.8–4.8)
Alkaline Phosphatase: 91 IU/L (ref 44–121)
BUN/Creatinine Ratio: 18 (ref 12–28)
BUN: 16 mg/dL (ref 8–27)
Bilirubin Total: 0.4 mg/dL (ref 0.0–1.2)
CO2: 24 mmol/L (ref 20–29)
Calcium: 9.9 mg/dL (ref 8.7–10.3)
Chloride: 105 mmol/L (ref 96–106)
Creatinine, Ser: 0.89 mg/dL (ref 0.57–1.00)
Globulin, Total: 3.2 g/dL (ref 1.5–4.5)
Glucose: 104 mg/dL — ABNORMAL HIGH (ref 65–99)
Potassium: 3.9 mmol/L (ref 3.5–5.2)
Sodium: 144 mmol/L (ref 134–144)
Total Protein: 7.8 g/dL (ref 6.0–8.5)
eGFR: 73 mL/min/{1.73_m2} (ref >59)

## 2021-01-18 LAB — LIPID PANEL
Chol/HDL Ratio: 2.9 ratio (ref 0.0–4.4)
Cholesterol, Total: 197 mg/dL (ref 100–199)
HDL: 69 mg/dL (ref >39)
LDL Chol Calc (NIH): 115 mg/dL — ABNORMAL HIGH (ref 0–99)
Triglycerides: 72 mg/dL (ref 0–149)
VLDL Cholesterol Cal: 13 mg/dL (ref 5–40)

## 2021-01-18 LAB — CBC WITH DIFFERENTIAL/PLATELET
Basophils Absolute: 0.1 10*3/uL (ref 0.0–0.2)
Basos: 1 %
EOS (ABSOLUTE): 0.1 10*3/uL (ref 0.0–0.4)
Eos: 1 %
Hematocrit: 37.9 % (ref 34.0–46.6)
Hemoglobin: 12.3 g/dL (ref 11.1–15.9)
Immature Grans (Abs): 0 10*3/uL (ref 0.0–0.1)
Immature Granulocytes: 0 %
Lymphocytes Absolute: 1.4 10*3/uL (ref 0.7–3.1)
Lymphs: 20 %
MCH: 29.4 pg (ref 26.6–33.0)
MCHC: 32.5 g/dL (ref 31.5–35.7)
MCV: 91 fL (ref 79–97)
Monocytes Absolute: 0.6 10*3/uL (ref 0.1–0.9)
Monocytes: 9 %
Neutrophils Absolute: 5 10*3/uL (ref 1.4–7.0)
Neutrophils: 69 %
Platelets: 316 10*3/uL (ref 150–450)
RBC: 4.19 x10E6/uL (ref 3.77–5.28)
RDW: 13.2 % (ref 11.7–15.4)
WBC: 7.1 10*3/uL (ref 3.4–10.8)

## 2021-01-18 LAB — URINALYSIS, ROUTINE W REFLEX MICROSCOPIC
Bilirubin, UA: NEGATIVE
Glucose, UA: NEGATIVE
Ketones, UA: NEGATIVE
Leukocytes,UA: NEGATIVE
Nitrite, UA: NEGATIVE
Protein,UA: NEGATIVE
RBC, UA: NEGATIVE
Specific Gravity, UA: 1.015 (ref 1.005–1.030)
Urobilinogen, Ur: 0.2 mg/dL (ref 0.2–1.0)
pH, UA: 5 (ref 5.0–7.5)

## 2021-01-18 LAB — HGB A1C W/O EAG: Hgb A1c MFr Bld: 5.4 % (ref 4.8–5.6)

## 2021-01-18 LAB — VITAMIN D 25 HYDROXY (VIT D DEFICIENCY, FRACTURES): Vit D, 25-Hydroxy: 50.2 ng/mL (ref 30.0–100.0)

## 2021-01-18 LAB — TSH: TSH: 1.93 u[IU]/mL (ref 0.450–4.500)

## 2021-01-18 LAB — D-DIMER, QUANTITATIVE: D-DIMER: 0.44 mg/L FEU (ref 0.00–0.49)

## 2021-01-19 NOTE — Progress Notes (Signed)
D dimer was within normal limits.  LDL elevated.  Discuss lifestyle modification with patient e.g. increase exercise, fiber, fruits, vegetables, lean meat, and omega 3/fish intake and decrease saturated fat.  If patient following strict diet and exercise program already please schedule follow up appointment with primary care physician Hemoglobin A1C is within normal limits.\CBC within normal limits. TSH thyroid within normal limits. CMP glucose mild elevation watch diet and exercise.

## 2021-01-20 ENCOUNTER — Telehealth: Payer: Self-pay | Admitting: Adult Health

## 2021-01-20 LAB — CULTURE, URINE COMPREHENSIVE

## 2021-01-20 NOTE — Telephone Encounter (Signed)
Lft pt a vm to call ofc to sch [place]. thanks

## 2021-01-23 ENCOUNTER — Telehealth: Payer: Self-pay | Admitting: Adult Health

## 2021-01-23 NOTE — Telephone Encounter (Signed)
Lft pt vm to call ofc to sch Vas [place]. thanks

## 2021-01-24 ENCOUNTER — Ambulatory Visit: Payer: 59

## 2021-01-25 NOTE — Progress Notes (Signed)
10,000 - 25,000 mixed urogenital flora, likely contamination , is he still having any symptoms now.

## 2021-02-01 ENCOUNTER — Ambulatory Visit (INDEPENDENT_AMBULATORY_CARE_PROVIDER_SITE_OTHER): Payer: 59

## 2021-02-01 DIAGNOSIS — R600 Localized edema: Secondary | ICD-10-CM | POA: Diagnosis not present

## 2021-02-01 DIAGNOSIS — M7989 Other specified soft tissue disorders: Secondary | ICD-10-CM

## 2021-02-07 ENCOUNTER — Other Ambulatory Visit: Payer: Self-pay | Admitting: Family

## 2021-02-07 DIAGNOSIS — Z0189 Encounter for other specified special examinations: Secondary | ICD-10-CM

## 2021-02-07 NOTE — Addendum Note (Signed)
Addended by: Dutch Quint B on: 02/07/2021 12:09 PM   Modules accepted: Orders

## 2021-03-13 ENCOUNTER — Ambulatory Visit: Payer: 59 | Admitting: Physician Assistant

## 2021-03-13 ENCOUNTER — Other Ambulatory Visit: Payer: Self-pay | Admitting: Physician Assistant

## 2021-03-13 ENCOUNTER — Other Ambulatory Visit: Payer: Self-pay

## 2021-03-13 ENCOUNTER — Encounter: Payer: Self-pay | Admitting: Physician Assistant

## 2021-03-13 VITALS — BP 133/85 | HR 82 | Ht 62.0 in | Wt 300.0 lb

## 2021-03-13 DIAGNOSIS — N3941 Urge incontinence: Secondary | ICD-10-CM

## 2021-03-13 LAB — BLADDER SCAN AMB NON-IMAGING

## 2021-03-13 MED ORDER — MIRABEGRON ER 50 MG PO TB24: 50.0000 mg | ORAL_TABLET | Freq: Every day | ORAL | 1 refills | Status: DC

## 2021-03-13 MED ORDER — OXYBUTYNIN CHLORIDE 5 MG PO TABS: ORAL_TABLET | ORAL | 0 refills | Status: DC

## 2021-03-13 NOTE — Progress Notes (Signed)
03/13/2021 11:46 AM   Jennifer Mcbride August 26, 1955 [id number]  CC: Chief Complaint  Patient presents with   Medication Problem    HPI: Jennifer Mcbride is a 65 y.o. female with PMH OAB wet who presents today for evaluation of refractory symptoms despite Myrbetriq 50 mg.  She was first seen by Dr. Matilde Sprang on 10/26/2019 and started on Myrbetriq, however she had several family emergencies thereafter and missed her follow-up appointment secondary to this.  Her PCP has been prescribing Myrbetriq in the interim.  Today she reports some improvement in her urinary symptoms on Myrbetriq, specifically in terms of her frequency.  Since she was last seen in clinic, she was diagnosed with OSA and started on CPAP, which she uses daily.  She reports a reverse work schedule and tends to go to bed at noon.  She has been trying to lose weight, and states she was advised that sleep quality will be a big factor in her success on this.  She states her sleep quality is improved now that she is on CPAP, however she is still waking to void every 3-4 hours and is wondering if anything can be done to increase her duration of uninterrupted sleep.  Bladder scan with 94 mL today, last void approximately 1 hour prior.  PMH: Past Medical History:  Diagnosis Date   Anemia    IDA   Hypertension    Obesity    Urine incontinence    Vitamin D deficiency     Surgical History: Past Surgical History:  Procedure Laterality Date   COLONOSCOPY WITH PROPOFOL     COLONOSCOPY WITH PROPOFOL N/A 03/28/2020   Procedure: COLONOSCOPY WITH PROPOFOL;  Surgeon: [name], MD;  Location: [facility] ENDOSCOPY;  Service: Endoscopy;  Laterality: N/A;   TUBAL LIGATION      Home Medications:  Allergies as of 03/13/2021   No Known Allergies      Medication List        Accurate as of March 13, 2021 11:46 AM. If you have any questions, ask your nurse or doctor.          bisoprolol-hydrochlorothiazide 5-6.25 MG  tablet Commonly known as: ZIAC Take 1 tablet by mouth daily.   mirabegron ER 50 MG Tb24 tablet Commonly known as: MYRBETRIQ Take 1 tablet (50 mg total) by mouth daily.   multivitamin tablet Take 1 tablet by mouth daily.   Vitamin D (Ergocalciferol) 1.25 MG (50000 UNIT) Caps capsule Commonly known as: DRISDOL TAKE 1 CAPSULE BY MOUTH  WEEKLY        Allergies:  No Known Allergies  Family History: Family History  Problem Relation Age of Onset   Hypertension Mother    Hearing loss Mother    Depression Mother    Arthritis Mother    Kidney disease Father    Hearing loss Father    Diabetes Father    Mental illness Sister    Early death Sister    Mental illness Sister    Kidney disease Sister    Early death Sister    Diabetes Sister    Mental illness Sister    Mental illness Brother    Arthritis Brother    Kidney disease Brother    Diabetes Brother    Kidney disease Brother    Diabetes Brother    Heart disease Son    Diabetes Son    Breast cancer Neg Hx     Social History:   reports that she has never smoked.  She has never used smokeless tobacco. She reports that she does not drink alcohol and does not use drugs.  Physical Exam: BP 133/85   Pulse 82   Ht '5\' 2"'$  (1.575 m)   Wt 300 lb (136.1 kg)   BMI 54.87 kg/m   Constitutional:  Alert and oriented, no acute distress, nontoxic appearing HEENT: [place], AT Cardiovascular: No clubbing, cyanosis, or edema Respiratory: Normal respiratory effort, no increased work of breathing Skin: No rashes, bruises or suspicious lesions Neurologic: Grossly intact, no focal deficits, moving all 4 extremities Psychiatric: Normal mood and affect  Laboratory Data: Results for orders placed or performed in visit on 03/13/21  BLADDER SCAN AMB NON-IMAGING  Result Value Ref Range   Scan Result 23m    Assessment & Plan:   1. Urgency incontinence PMH OAB with improvement on Myrbetriq 50 mg daily, however she continues to have  bothersome nocturia despite appropriate CPAP use and is concerned that this is impeding her weight loss efforts.  We discussed the role of OSA and nocturia and I encouraged her to continue CPAP use.  Additionally, I explained that I would like to augment her daily Myrbetriq regimen with a dose of oxybutynin 5 mg just prior to bed.  She is emptying appropriately today.  Patient is in agreement with this plan. - BLADDER SCAN AMB NON-IMAGING - oxybutynin (DITROPAN) 5 MG tablet; Take 1 tablet daily 1 hour prior to bed.  Dispense: 30 tablet; Refill: 0 - mirabegron ER (MYRBETRIQ) 50 MG TB24 tablet; Take 1 tablet (50 mg total) by mouth daily.  Dispense: 90 tablet; Refill: 1   Return in about 4 weeks (around 04/10/2021) for Symptom recheck with PVR.  SDebroah Loop PA-C  [facility] [address] [place] [phone]

## 2021-03-23 ENCOUNTER — Encounter: Payer: Self-pay | Admitting: Adult Health

## 2021-03-29 ENCOUNTER — Other Ambulatory Visit: Payer: Self-pay | Admitting: Adult Health

## 2021-03-29 ENCOUNTER — Other Ambulatory Visit: Payer: Self-pay | Admitting: Internal Medicine

## 2021-03-29 DIAGNOSIS — Z1231 Encounter for screening mammogram for malignant neoplasm of breast: Secondary | ICD-10-CM

## 2021-04-06 ENCOUNTER — Telehealth: Payer: 59 | Admitting: Family Medicine

## 2021-04-13 ENCOUNTER — Ambulatory Visit
Admission: RE | Admit: 2021-04-13 | Discharge: 2021-04-13 | Disposition: A | Discharge status: Home / Self Care | Payer: 59 | Source: Ambulatory Visit | Attending: Adult Health | Admitting: Adult Health

## 2021-04-13 ENCOUNTER — Other Ambulatory Visit: Payer: Self-pay

## 2021-04-13 ENCOUNTER — Ambulatory Visit: Payer: Self-pay | Admitting: Physician Assistant

## 2021-04-13 DIAGNOSIS — Z1231 Encounter for screening mammogram for malignant neoplasm of breast: Secondary | ICD-10-CM | POA: Diagnosis present

## 2021-04-14 ENCOUNTER — Encounter: Payer: Self-pay | Admitting: Physician Assistant

## 2021-04-20 ENCOUNTER — Ambulatory Visit: Payer: 59 | Admitting: Adult Health

## 2021-04-20 ENCOUNTER — Ambulatory Visit: Payer: 59

## 2021-05-09 ENCOUNTER — Ambulatory Visit: Payer: 59 | Admitting: Adult Health

## 2021-05-18 ENCOUNTER — Telehealth: Payer: Self-pay | Admitting: Adult Health

## 2021-05-18 DIAGNOSIS — R32 Unspecified urinary incontinence: Secondary | ICD-10-CM

## 2021-05-18 DIAGNOSIS — N3941 Urge incontinence: Secondary | ICD-10-CM

## 2021-05-18 NOTE — Telephone Encounter (Signed)
Patient seen by Sharyn Lull at our practice 01/2021. Needing a refill, Pended for your approval or denial.

## 2021-05-19 MED ORDER — BISOPROLOL-HYDROCHLOROTHIAZIDE 5-6.25 MG PO TABS: 1.0000 | ORAL_TABLET | Freq: Every day | ORAL | 1 refills | Status: DC

## 2021-05-19 NOTE — Telephone Encounter (Signed)
Refill sent.

## 2021-06-12 ENCOUNTER — Ambulatory Visit: Payer: 59 | Admitting: Adult Health

## 2021-06-19 ENCOUNTER — Other Ambulatory Visit: Payer: Self-pay | Admitting: Physician Assistant

## 2021-06-19 DIAGNOSIS — N3941 Urge incontinence: Secondary | ICD-10-CM

## 2021-07-10 ENCOUNTER — Other Ambulatory Visit: Payer: Self-pay

## 2021-07-10 ENCOUNTER — Ambulatory Visit: Payer: 59 | Admitting: Adult Health

## 2021-07-10 ENCOUNTER — Encounter: Payer: Self-pay | Admitting: Adult Health

## 2021-07-10 VITALS — BP 124/82 | HR 61 | Temp 96.7°F | Ht 62.0 in | Wt 291.8 lb

## 2021-07-10 DIAGNOSIS — Z0189 Encounter for other specified special examinations: Secondary | ICD-10-CM

## 2021-07-10 DIAGNOSIS — R32 Unspecified urinary incontinence: Secondary | ICD-10-CM

## 2021-07-10 DIAGNOSIS — E559 Vitamin D deficiency, unspecified: Secondary | ICD-10-CM

## 2021-07-10 DIAGNOSIS — R399 Unspecified symptoms and signs involving the genitourinary system: Secondary | ICD-10-CM

## 2021-07-10 DIAGNOSIS — I10 Essential (primary) hypertension: Secondary | ICD-10-CM

## 2021-07-10 DIAGNOSIS — M7989 Other specified soft tissue disorders: Secondary | ICD-10-CM

## 2021-07-10 DIAGNOSIS — Z6841 Body Mass Index (BMI) 40.0 and over, adult: Secondary | ICD-10-CM

## 2021-07-10 MED ORDER — BISOPROLOL-HYDROCHLOROTHIAZIDE 5-6.25 MG PO TABS: 1.0000 | ORAL_TABLET | Freq: Every day | ORAL | 1 refills | Status: DC

## 2021-07-10 MED ORDER — VITAMIN D (ERGOCALCIFEROL) 1.25 MG (50000 UNIT) PO CAPS: 50000.0000 [IU] | ORAL_CAPSULE | ORAL | 0 refills | Status: DC

## 2021-07-10 NOTE — Addendum Note (Signed)
Addended by: Doreen Beam on: 07/10/2021 08:07 AM   Modules accepted: Orders

## 2021-07-10 NOTE — Progress Notes (Addendum)
Established Patient Office Visit  Subjective:  Patient ID: Jennifer Mcbride, female    DOB: 05/15/1956  Age: 65 y.o. MRN: [id number]  CC:  Chief Complaint  Patient presents with   Follow-up    HPI Jennifer Mcbride presents for follow up on her right leg, she was referred by Dutch Quint for Ultrasound essentially normal except Incidental Finding: Reflux (blood back up) seen in the Right GSV Distal Thigh area. Reflux only seen in this area. I will refer to vascular . Patient does not remember being called.  Will place new referral.  She still has some mild edema in that leg. Denies any pain.  She feels well.  She has stress incontinence of urine, she has not been taking Oxybutynin  or Myrteribic. She prefers right now to just see how she does without either.  She is starting to walk and getting ready to retire and hopes she can be more active with her oncoming returement.   Patient  denies any fever, body aches,chills, rash, chest pain, shortness of breath, nausea, vomiting, or diarrhea.     Past Medical History:  Diagnosis Date   Anemia    IDA   Hypertension    Obesity    Urine incontinence    Vitamin D deficiency     Past Surgical History:  Procedure Laterality Date   COLONOSCOPY WITH PROPOFOL     COLONOSCOPY WITH PROPOFOL N/A 03/28/2020   Procedure: COLONOSCOPY WITH PROPOFOL;  Surgeon: [name], MD;  Location: [facility] ENDOSCOPY;  Service: Endoscopy;  Laterality: N/A;   TUBAL LIGATION      Family History  Problem Relation Age of Onset   Hypertension Mother    Hearing loss Mother    Depression Mother    Arthritis Mother    Kidney disease Father    Hearing loss Father    Diabetes Father    Mental illness Sister    Early death Sister    Mental illness Sister    Kidney disease Sister    Early death Sister    Diabetes Sister    Mental illness Sister    Mental illness Brother    Arthritis Brother    Kidney disease Brother    Diabetes Brother     Kidney disease Brother    Diabetes Brother    Heart disease Son    Diabetes Son    Breast cancer Neg Hx     Social History   Socioeconomic History   Marital status: Divorced    Spouse name: Not on file   Number of children: Not on file   Years of education: Not on file   Highest education level: Not on file  Occupational History   Not on file  Tobacco Use   Smoking status: Never   Smokeless tobacco: Never  Vaping Use   Vaping Use: Never used  Substance and Sexual Activity   Alcohol use: No   Drug use: Never   Sexual activity: Not Currently  Other Topics Concern   Not on file  Social History Narrative   Not on file   Social Determinants of Health   Financial Resource Strain: Not on file  Food Insecurity: Not on file  Transportation Needs: Not on file  Physical Activity: Not on file  Stress: Not on file  Social Connections: Not on file  Intimate Partner Violence: Not on file    Outpatient Medications Prior to Visit  Medication Sig Dispense Refill   Multiple Vitamin (MULTIVITAMIN) tablet  Take 1 tablet by mouth daily.     bisoprolol-hydrochlorothiazide (ZIAC) 5-6.25 MG tablet Take 1 tablet by mouth daily. 90 tablet 1   Vitamin D, Ergocalciferol, (DRISDOL) 50000 units CAPS capsule TAKE 1 CAPSULE BY MOUTH  WEEKLY     mirabegron ER (MYRBETRIQ) 50 MG TB24 tablet Take 1 tablet (50 mg total) by mouth daily. (Patient not taking: Reported on 07/10/2021) 90 tablet 1   oxybutynin (DITROPAN) 5 MG tablet TAKE 1 TABLET BY MOUTH DAILY 1 HOUR PRIOR TO BEDTIME (Patient not taking: Reported on 07/10/2021) 90 tablet 3   No facility-administered medications prior to visit.    No Known Allergies  ROS Review of Systems  Constitutional: Negative.   HENT: Negative.    Cardiovascular: Negative.   Gastrointestinal: Negative.   Genitourinary:  Positive for frequency. Negative for decreased urine volume, difficulty urinating, dyspareunia, dysuria, enuresis, flank pain, genital sores,  hematuria, menstrual problem, pelvic pain, urgency, vaginal bleeding and vaginal pain.     Objective:    Physical Exam Vitals reviewed.  Constitutional:      General: She is not in acute distress.    Appearance: She is well-developed. She is obese. She is not diaphoretic.     Interventions: She is not intubated. HENT:     Head: Normocephalic and atraumatic.     Right Ear: External ear normal.     Left Ear: External ear normal.     Nose: Nose normal.     Mouth/Throat:     Pharynx: No oropharyngeal exudate or posterior oropharyngeal erythema.  Eyes:     General: Lids are normal. No scleral icterus.       Right eye: No discharge.        Left eye: No discharge.     Conjunctiva/sclera: Conjunctivae normal.     Right eye: Right conjunctiva is not injected. No exudate or hemorrhage.    Left eye: Left conjunctiva is not injected. No exudate or hemorrhage.    Pupils: Pupils are equal, round, and reactive to light.  Neck:     Thyroid: No thyroid mass or thyromegaly.     Vascular: Normal carotid pulses. No carotid bruit, hepatojugular reflux or JVD.     Trachea: Trachea and phonation normal. No tracheal tenderness or tracheal deviation.     Meningeal: Brudzinski's sign and Kernig's sign absent.  Cardiovascular:     Rate and Rhythm: Normal rate and regular rhythm.     Pulses: Normal pulses.          Radial pulses are 2+ on the right side and 2+ on the left side.       Dorsalis pedis pulses are 2+ on the right side and 2+ on the left side.       Posterior tibial pulses are 2+ on the right side and 2+ on the left side.     Heart sounds: Normal heart sounds, S1 normal and S2 normal. Heart sounds not distant. No murmur heard.   No friction rub. No gallop.  Pulmonary:     Effort: Pulmonary effort is normal. No tachypnea, bradypnea, accessory muscle usage or respiratory distress. She is not intubated.     Breath sounds: Normal breath sounds. No stridor. No wheezing, rhonchi or rales.  Chest:      Chest wall: No tenderness.  Abdominal:     General: Bowel sounds are normal. There is no distension or abdominal bruit.     Palpations: Abdomen is soft. There is no shifting dullness, fluid wave, hepatomegaly, splenomegaly,  mass or pulsatile mass.     Tenderness: There is no abdominal tenderness. There is no guarding or rebound.     Hernia: No hernia is present.  Musculoskeletal:        General: No tenderness or deformity. Normal range of motion.     Cervical back: Full passive range of motion without pain, normal range of motion and neck supple. No edema, erythema or rigidity. No spinous process tenderness or muscular tenderness. Normal range of motion.     Right lower leg: Edema (chronic thigh) present.     Left lower leg: No edema.  Lymphadenopathy:     Head:     Right side of head: No submental, submandibular, tonsillar, preauricular, posterior auricular or occipital adenopathy.     Left side of head: No submental, submandibular, tonsillar, preauricular, posterior auricular or occipital adenopathy.     Cervical: No cervical adenopathy.     Right cervical: No superficial, deep or posterior cervical adenopathy.    Left cervical: No superficial, deep or posterior cervical adenopathy.     Upper Body:     Right upper body: No supraclavicular or pectoral adenopathy.     Left upper body: No supraclavicular or pectoral adenopathy.  Skin:    General: Skin is warm and dry.     Coloration: Skin is not jaundiced or pale.     Findings: No abrasion, bruising, burn, ecchymosis, erythema, lesion, petechiae or rash.     Nails: There is no clubbing.  Neurological:     Mental Status: She is alert and oriented to person, place, and time.     GCS: GCS eye subscore is 4. GCS verbal subscore is 5. GCS motor subscore is 6.     Cranial Nerves: No cranial nerve deficit.     Sensory: No sensory deficit.     Motor: No weakness, tremor, atrophy, abnormal muscle tone or seizure activity.     Coordination:  Coordination normal.     Gait: Gait normal.     Deep Tendon Reflexes: Reflexes are normal and symmetric. Reflexes normal. Babinski sign absent on the right side. Babinski sign absent on the left side.     Reflex Scores:      Tricep reflexes are 2+ on the right side and 2+ on the left side.      Bicep reflexes are 2+ on the right side and 2+ on the left side.      Brachioradialis reflexes are 2+ on the right side and 2+ on the left side.      Patellar reflexes are 2+ on the right side and 2+ on the left side.      Achilles reflexes are 2+ on the right side and 2+ on the left side. Psychiatric:        Speech: Speech normal.        Behavior: Behavior normal.        Thought Content: Thought content normal.        Judgment: Judgment normal.    BP 124/82 (BP Location: Left Arm, Patient Position: Sitting, Cuff Size: Large)   Pulse 61   Temp (!) 96.7 F (35.9 C) (Temporal)   Ht _0  (1.575 m)   Wt 291 lb 12.8 oz (132.4 kg)   SpO2 99%   BMI 53.37 kg/m  Wt Readings from Last 3 Encounters:  07/10/21 291 lb 12.8 oz (132.4 kg)  03/13/21 300 lb (136.1 kg)  01/17/21 296 lb 12.8 oz (134.6 kg)     There  are no preventive care reminders to display for this patient.   There are no preventive care reminders to display for this patient.  Lab Results  Component Value Date   TSH 1.930 01/17/2021   Lab Results  Component Value Date   WBC 7.1 01/17/2021   HGB 12.3 01/17/2021   HCT 37.9 01/17/2021   MCV 91 01/17/2021   PLT 316 01/17/2021   Lab Results  Component Value Date   NA 144 01/17/2021   K 3.9 01/17/2021   CO2 24 01/17/2021   GLUCOSE 104 (H) 01/17/2021   BUN 16 01/17/2021   CREATININE 0.89 01/17/2021   BILITOT 0.4 01/17/2021   ALKPHOS 91 01/17/2021   AST 14 01/17/2021   ALT 13 01/17/2021   PROT 7.8 01/17/2021   ALBUMIN 4.6 01/17/2021   CALCIUM 9.9 01/17/2021   EGFR 73 01/17/2021   Lab Results  Component Value Date   CHOL 197 01/17/2021   Lab Results  Component  Value Date   HDL 69 01/17/2021   Lab Results  Component Value Date   LDLCALC 115 (H) 01/17/2021   Lab Results  Component Value Date   TRIG 72 01/17/2021   Lab Results  Component Value Date   CHOLHDL 2.9 01/17/2021   Lab Results  Component Value Date   HGBA1C 5.4 01/17/2021      Assessment & Plan:   Problem List Items Addressed This Visit       Other   Vitamin D deficiency   Relevant Orders   VITAMIN D 25 Hydroxy (Vit-D Deficiency, Fractures)   Right leg swelling - Primary   Relevant Orders   Ambulatory referral to Vascular Surgery   Urinary incontinence   Relevant Medications   bisoprolol-hydrochlorothiazide (ZIAC) 5-6.25 MG tablet   Encounter for imaging of bilateral greater saphenous veins   Relevant Orders   Ambulatory referral to Vascular Surgery   Other Visit Diagnoses     Hypertension, unspecified type       Relevant Medications   bisoprolol-hydrochlorothiazide (ZIAC) 5-6.25 MG tablet   Other Relevant Orders   CBC with Differential/Platelet   Comprehensive metabolic panel   TSH   Urine Microscopic Only   Urinary symptom or sign       Relevant Orders   Urine Microscopic Only   Urine Culture   Body mass index (BMI) of 50-59.9 in adult [facility])          The patient is advised to begin progressive daily aerobic exercise program, follow a low fat, low cholesterol diet, attempt to lose weight, and return for routine annual checkups.   Meds ordered this encounter  Medications   bisoprolol-hydrochlorothiazide (ZIAC) 5-6.25 MG tablet    Sig: Take 1 tablet by mouth daily.    Dispense:  90 tablet    Refill:  1   Vitamin D, Ergocalciferol, (DRISDOL) 1.25 MG (50000 UNIT) CAPS capsule    Sig: Take 1 capsule (50,000 Units total) by mouth once a week.    Dispense:  10 capsule    Refill:  0  Labs today.   Red Flags discussed. The patient was given clear instructions to go to ER or return to medical center if any red flags develop, symptoms do not improve,  worsen or new problems develop. They verbalized understanding.   Follow-up: Return in about 6 months (around 01/08/2022), or if symptoms worsen or fail to improve.    Marcille Buffy, FNP

## 2021-07-10 NOTE — Addendum Note (Signed)
Addended by: Leeanne Rio on: 07/10/2021 08:11 AM   Modules accepted: Orders

## 2021-07-10 NOTE — Patient Instructions (Addendum)
Health Maintenance, Female Adopting a healthy lifestyle and getting preventive care are important in promoting health and wellness. Ask your health care provider about: The right schedule for you to have regular tests and exams. Things you can do on your own to prevent diseases and keep yourself healthy. What should I know about diet, weight, and exercise? Eat a healthy diet  Eat a diet that includes plenty of vegetables, fruits, low-fat dairy products, and lean protein. Do not eat a lot of foods that are high in solid fats, added sugars, or sodium. Maintain a healthy weight Body mass index (BMI) is used to identify weight problems. It estimates body fat based on height and weight. Your health care provider can help determine your BMI and help you achieve or maintain a healthy weight. Get regular exercise Get regular exercise. This is one of the most important things you can do for your health. Most adults should: Exercise for at least 150 minutes each week. The exercise should increase your heart rate and make you sweat (moderate-intensity exercise). Do strengthening exercises at least twice a week. This is in addition to the moderate-intensity exercise. Spend less time sitting. Even light physical activity can be beneficial. Watch cholesterol and blood lipids Have your blood tested for lipids and cholesterol at 64 years of age, then have this test every 5 years. Have your cholesterol levels checked more often if: Your lipid or cholesterol levels are high. You are older than 64 years of age. You are at high risk for heart disease. What should I know about cancer screening? Depending on your health history and family history, you may need to have cancer screening at various ages. This may include screening for: Breast cancer. Cervical cancer. Colorectal cancer. Skin cancer. Lung cancer. What should I know about heart disease, diabetes, and high blood pressure? Blood pressure and heart  disease High blood pressure causes heart disease and increases the risk of stroke. This is more likely to develop in people who have high blood pressure readings or are overweight. Have your blood pressure checked: Every 3-5 years if you are 18-39 years of age. Every year if you are 40 years old or older. Diabetes Have regular diabetes screenings. This checks your fasting blood sugar level. Have the screening done: Once every three years after age 40 if you are at a normal weight and have a low risk for diabetes. More often and at a younger age if you are overweight or have a high risk for diabetes. What should I know about preventing infection? Hepatitis B If you have a higher risk for hepatitis B, you should be screened for this virus. Talk with your health care provider to find out if you are at risk for hepatitis B infection. Hepatitis C Testing is recommended for: Everyone born from 1945 through 1965. Anyone with known risk factors for hepatitis C. Sexually transmitted infections (STIs) Get screened for STIs, including gonorrhea and chlamydia, if: You are sexually active and are younger than 64 years of age. You are older than 64 years of age and your health care provider tells you that you are at risk for this type of infection. Your sexual activity has changed since you were last screened, and you are at increased risk for chlamydia or gonorrhea. Ask your health care provider if you are at risk. Ask your health care provider about whether you are at high risk for HIV. Your health care provider may recommend a prescription medicine to help prevent HIV   infection. If you choose to take medicine to prevent HIV, you should first get tested for HIV. You should then be tested every 3 months for as long as you are taking the medicine. Pregnancy If you are about to stop having your period (premenopausal) and you may become pregnant, seek counseling before you get pregnant. Take 400 to 800  micrograms (mcg) of folic acid every day if you become pregnant. Ask for birth control (contraception) if you want to prevent pregnancy. Osteoporosis and menopause Osteoporosis is a disease in which the bones lose minerals and strength with aging. This can result in bone fractures. If you are 89 years old or older, or if you are at risk for osteoporosis and fractures, ask your health care provider if you should: Be screened for bone loss. Take a calcium or vitamin D supplement to lower your risk of fractures. Be given hormone replacement therapy (HRT) to treat symptoms of menopause. Follow these instructions at home: Alcohol use Do not drink alcohol if: Your health care provider tells you not to drink. You are pregnant, may be pregnant, or are planning to become pregnant. If you drink alcohol: Limit how much you have to: 0-1 drink a day. Know how much alcohol is in your drink. In the [place] one drink equals one 12 oz bottle of beer (355 mL), one 5 oz glass of wine (148 mL), or one 1 oz glass of hard liquor (44 mL). Lifestyle Do not use any products that contain nicotine or tobacco. These products include cigarettes, chewing tobacco, and vaping devices, such as e-cigarettes. If you need help quitting, ask your health care provider. Do not use street drugs. Do not share needles. Ask your health care provider for help if you need support or information about quitting drugs. General instructions Schedule regular health, dental, and eye exams. Stay current with your vaccines. Tell your health care provider if: You often feel depressed. You have ever been abused or do not feel safe at home. Summary Adopting a healthy lifestyle and getting preventive care are important in promoting health and wellness. Follow your health care provider's instructions about healthy diet, exercising, and getting tested or screened for diseases. Follow your health care provider's instructions on monitoring your  cholesterol and blood pressure. This information is not intended to replace advice given to you by your health care provider. Make sure you discuss any questions you have with your health care provider. Document Revised: 12/12/2020 Document Reviewed: 12/12/2020 Elsevier Patient Education  [place] [employer] Calories are units of energy. Your body needs a certain number of calories from food to keep going throughout the day. When you eat or drink more calories than your body needs, your body stores the extra calories mostly as fat. When you eat or drink fewer calories than your body needs, your body burns fat to get the energy it needs. Calorie counting means keeping track of how many calories you eat and drink each day. Calorie counting can be helpful if you need to lose weight. If you eat fewer calories than your body needs, you should lose weight. Ask your health care provider what a healthy weight is for you. For calorie counting to work, you will need to eat the right number of calories each day to lose a healthy amount of weight per week. A dietitian can help you figure out how many calories you need in a day and will suggest ways to reach your calorie goal.  A healthy amount of weight to lose each week is usually 1-2 lb (0.5-0.9 kg). This usually means that your daily calorie intake should be reduced by 500-750 calories. Eating 1,200-1,500 calories a day can help most women lose weight. Eating 1,500-1,800 calories a day can help most men lose weight. What do I need to know about calorie counting? Work with your health care provider or dietitian to determine how many calories you should get each day. To meet your daily calorie goal, you will need to: Find out how many calories are in each food that you would like to eat. Try to do this before you eat. Decide how much of the food you plan to eat. Keep a food log. Do this by writing down what you ate and how many  calories it had. To successfully lose weight, it is important to balance calorie counting with a healthy lifestyle that includes regular activity. Where do I find calorie information? The number of calories in a food can be found on a Nutrition Facts label. If a food does not have a Nutrition Facts label, try to look up the calories online or ask your dietitian for help. Remember that calories are listed per serving. If you choose to have more than one serving of a food, you will have to multiply the calories per serving by the number of servings you plan to eat. For example, the label on a package of bread might say that a serving size is 1 slice and that there are 90 calories in a serving. If you eat 1 slice, you will have eaten 90 calories. If you eat 2 slices, you will have eaten 180 calories. How do I keep a food log? After each time that you eat, record the following in your food log as soon as possible: What you ate. Be sure to include toppings, sauces, and other extras on the food. How much you ate. This can be measured in cups, ounces, or number of items. How many calories were in each food and drink. The total number of calories in the food you ate. Keep your food log near you, such as in a pocket-sized notebook or on an app or website on your mobile phone. Some programs will calculate calories for you and show you how many calories you have left to meet your daily goal. What are some portion-control tips? Know how many calories are in a serving. This will help you know how many servings you can have of a certain food. Use a measuring cup to measure serving sizes. You could also try weighing out portions on a kitchen scale. With time, you will be able to estimate serving sizes for some foods. Take time to put servings of different foods on your favorite plates or in your favorite bowls and cups so you know what a serving looks like. Try not to eat straight from a food's packaging, such as  from a bag or box. Eating straight from the package makes it hard to see how much you are eating and can lead to overeating. Put the amount you would like to eat in a cup or on a plate to make sure you are eating the right portion. Use smaller plates, glasses, and bowls for smaller portions and to prevent overeating. Try not to multitask. For example, avoid watching TV or using your computer while eating. If it is time to eat, sit down at a table and enjoy your food. This will help you recognize  when you are full. It will also help you be more mindful of what and how much you are eating. What are tips for following this plan? Reading food labels Check the calorie count compared with the serving size. The serving size may be smaller than what you are used to eating. Check the source of the calories. Try to choose foods that are high in protein, fiber, and vitamins, and low in saturated fat, trans fat, and sodium. Shopping Read nutrition labels while you shop. This will help you make healthy decisions about which foods to buy. Pay attention to nutrition labels for low-fat or fat-free foods. These foods sometimes have the same number of calories or more calories than the full-fat versions. They also often have added sugar, starch, or salt to make up for flavor that was removed with the fat. Make a grocery list of lower-calorie foods and stick to it. Cooking Try to cook your favorite foods in a healthier way. For example, try baking instead of frying. Use low-fat dairy products. Meal planning Use more fruits and vegetables. One-half of your plate should be fruits and vegetables. Include lean proteins, such as chicken, [place], and fish. Lifestyle Each week, aim to do one of the following: 150 minutes of moderate exercise, such as walking. 75 minutes of vigorous exercise, such as running. General information Know how many calories are in the foods you eat most often. This will help you calculate  calorie counts faster. Find a way of tracking calories that works for you. Get creative. Try different apps or programs if writing down calories does not work for you. What foods should I eat?  Eat nutritious foods. It is better to have a nutritious, high-calorie food, such as an avocado, than a food with few nutrients, such as a bag of potato chips. Use your calories on foods and drinks that will fill you up and will not leave you hungry soon after eating. Examples of foods that fill you up are nuts and nut butters, vegetables, lean proteins, and high-fiber foods such as whole grains. High-fiber foods are foods with more than 5 g of fiber per serving. Pay attention to calories in drinks. Low-calorie drinks include water and unsweetened drinks. The items listed above may not be a complete list of foods and beverages you can eat. Contact a dietitian for more information. What foods should I limit? Limit foods or drinks that are not good sources of vitamins, minerals, or protein or that are high in unhealthy fats. These include: Candy. Other sweets. Sodas, specialty coffee drinks, alcohol, and juice. The items listed above may not be a complete list of foods and beverages you should avoid. Contact a dietitian for more information. How do I count calories when eating out? Pay attention to portions. Often, portions are much larger when eating out. Try these tips to keep portions smaller: Consider sharing a meal instead of getting your own. If you get your own meal, eat only half of it. Before you start eating, ask for a container and put half of your meal into it. When available, consider ordering smaller portions from the menu instead of full portions. Pay attention to your food and drink choices. Knowing the way food is cooked and what is included with the meal can help you eat fewer calories. If calories are listed on the menu, choose the lower-calorie options. Choose dishes that include  vegetables, fruits, whole grains, low-fat dairy products, and lean proteins. Choose items that are boiled, broiled,  grilled, or steamed. Avoid items that are buttered, battered, fried, or served with cream sauce. Items labeled as crispy are usually fried, unless stated otherwise. Choose water, low-fat milk, unsweetened iced tea, or other drinks without added sugar. If you want an alcoholic beverage, choose a lower-calorie option, such as a glass of wine or light beer. Ask for dressings, sauces, and syrups on the side. These are usually high in calories, so you should limit the amount you eat. If you want a salad, choose a garden salad and ask for grilled meats. Avoid extra toppings such as bacon, cheese, or fried items. Ask for the dressing on the side, or ask for olive oil and vinegar or lemon to use as dressing. Estimate how many servings of a food you are given. Knowing serving sizes will help you be aware of how much food you are eating at restaurants. Where to find more information Centers for Disease Control and Prevention: http://www.wolf.info/ U.S. Department of Agriculture: http://www.wilson-mendoza.org/ Summary Calorie counting means keeping track of how many calories you eat and drink each day. If you eat fewer calories than your body needs, you should lose weight. A healthy amount of weight to lose per week is usually 1-2 lb (0.5-0.9 kg). This usually means reducing your daily calorie intake by 500-750 calories. The number of calories in a food can be found on a Nutrition Facts label. If a food does not have a Nutrition Facts label, try to look up the calories online or ask your dietitian for help. Use smaller plates, glasses, and bowls for smaller portions and to prevent overeating. Use your calories on foods and drinks that will fill you up and not leave you hungry shortly after a meal. This information is not intended to replace advice given to you by your health care provider. Make sure you discuss any questions  you have with your health care provider. Document Revised: 09/03/2019 Document Reviewed: 09/03/2019 Elsevier Patient Education  2022 [place] and Cholesterol Restricted Eating Plan Getting too much fat and cholesterol in your diet may cause health problems. Choosing the right foods helps keep your fat and cholesterol at normal levels. This can keep you from getting certain diseases. Your doctor may recommend an eating plan that includes: Total fat: ______% or less of total calories a day. This is ______g of fat a day. Saturated fat: ______% or less of total calories a day. This is ______g of saturated fat a day. Cholesterol: less than _________mg a day. Fiber: ______g a day. What are tips for following this plan? General tips Work with your doctor to lose weight if you need to. Avoid: Foods with added sugar. Fried foods. Foods with trans fat or partially hydrogenated oils. This includes some margarines and baked goods. If you drink alcohol: Limit how much you have to: 0-1 drink a day for women who are not pregnant. 0-2 drinks a day for men. Know how much alcohol is in a drink. In the [place] one drink equals one 12 oz bottle of beer (355 mL), one 5 oz glass of wine (148 mL), or one 1 oz glass of hard liquor (44 mL). Reading food labels Check food labels for: Trans fats. Partially hydrogenated oils. Saturated fat (g) in each serving. Cholesterol (mg) in each serving. Fiber (g) in each serving. Choose foods with healthy fats, such as: Monounsaturated fats and polyunsaturated fats. These include olive and canola oil, flaxseeds, walnuts, almonds, and seeds. Omega-3 fats. These are found in certain fish,  flaxseed oil, and ground flaxseeds. Choose grain products that have whole grains. Look for the word "whole" as the first word in the ingredient list. Cooking Cook foods using low-fat methods. These include baking, boiling, grilling, and broiling. Eat more home-cooked foods.  Eat at restaurants and buffets less often. Eat less fast food. Avoid cooking using saturated fats, such as butter, cream, palm oil, palm kernel oil, and coconut oil. Meal planning  At meals, divide your plate into four equal parts: Fill one-half of your plate with vegetables, green salads, and fruit. Fill one-fourth of your plate with whole grains. Fill one-fourth of your plate with low-fat (lean) protein foods. Eat fish that is high in omega-3 fats at least two times a week. This includes mackerel, tuna, sardines, and salmon. Eat foods that are high in fiber, such as whole grains, beans, apples, pears, berries, broccoli, carrots, peas, and barley. What foods should I eat? Fruits All fresh, canned (in natural juice), or frozen fruits. Vegetables Fresh or frozen vegetables (raw, steamed, roasted, or grilled). Green salads. Grains Whole grains, such as whole wheat or whole grain breads, crackers, cereals, and pasta. Unsweetened oatmeal, bulgur, barley, quinoa, or brown rice. Corn or whole wheat flour tortillas. Meats and other protein foods Ground beef (85% or leaner), grass-fed beef, or beef trimmed of fat. Skinless chicken or [place]. Ground chicken or [place]. Pork trimmed of fat. All fish and seafood. Egg whites. Dried beans, peas, or lentils. Unsalted nuts or seeds. Unsalted canned beans. Nut butters without added sugar or oil. Dairy Low-fat or nonfat dairy products, such as skim or 1% milk, 2% or reduced-fat cheeses, low-fat and fat-free ricotta or cottage cheese, or plain low-fat and nonfat yogurt. Fats and oils Tub margarine without trans fats. Light or reduced-fat mayonnaise and salad dressings. Avocado. Olive, canola, sesame, or safflower oils. The items listed above may not be a complete list of foods and beverages you can eat. Contact a dietitian for more information. What foods should I avoid? Fruits Canned fruit in heavy syrup. Fruit in cream or butter sauce. Fried  fruit. Vegetables Vegetables cooked in cheese, cream, or butter sauce. Fried vegetables. Grains White bread. White pasta. White rice. Cornbread. Bagels, pastries, and croissants. Crackers and snack foods that contain trans fat and hydrogenated oils. Meats and other protein foods Fatty cuts of meat. Ribs, chicken wings, bacon, sausage, bologna, salami, chitterlings, fatback, hot dogs, bratwurst, and packaged lunch meats. Liver and organ meats. Whole eggs and egg yolks. Chicken and [place] with skin. Fried meat. Dairy Whole or 2% milk, cream, half-and-half, and cream cheese. Whole milk cheeses. Whole-fat or sweetened yogurt. Full-fat cheeses. Nondairy creamers and whipped toppings. Processed cheese, cheese spreads, and cheese curds. Fats and oils Butter, stick margarine, lard, shortening, ghee, or bacon fat. Coconut, palm kernel, and palm oils. Beverages Alcohol. Sugar-sweetened drinks such as sodas, lemonade, and fruit drinks. Sweets and desserts Corn syrup, sugars, honey, and molasses. Candy. Jam and jelly. Syrup. Sweetened cereals. Cookies, pies, cakes, donuts, muffins, and ice cream. The items listed above may not be a complete list of foods and beverages you should avoid. Contact a dietitian for more information. Summary Choosing the right foods helps keep your fat and cholesterol at normal levels. This can keep you from getting certain diseases. At meals, fill one-half of your plate with vegetables, green salads, and fruits. Eat high fiber foods, like whole grains, beans, apples, pears, berries, carrots, peas, and barley. Limit added sugar, saturated fats, alcohol, and fried foods. This information  is not intended to replace advice given to you by your health care provider. Make sure you discuss any questions you have with your health care provider. Document Revised: 12/02/2020 Document Reviewed: 12/02/2020 Elsevier Patient Education  [place].

## 2021-07-11 LAB — CBC WITH DIFFERENTIAL/PLATELET
Basophils Absolute: 0.1 10*3/uL (ref 0.0–0.2)
Basos: 1 %
EOS (ABSOLUTE): 0.1 10*3/uL (ref 0.0–0.4)
Eos: 3 %
Hematocrit: 36.3 % (ref 34.0–46.6)
Hemoglobin: 12.1 g/dL (ref 11.1–15.9)
Immature Grans (Abs): 0 10*3/uL (ref 0.0–0.1)
Immature Granulocytes: 0 %
Lymphocytes Absolute: 1.1 10*3/uL (ref 0.7–3.1)
Lymphs: 22 %
MCH: 29.2 pg (ref 26.6–33.0)
MCHC: 33.3 g/dL (ref 31.5–35.7)
MCV: 88 fL (ref 79–97)
Monocytes Absolute: 0.5 10*3/uL (ref 0.1–0.9)
Monocytes: 9 %
Neutrophils Absolute: 3.3 10*3/uL (ref 1.4–7.0)
Neutrophils: 65 %
Platelets: 272 10*3/uL (ref 150–450)
RBC: 4.14 x10E6/uL (ref 3.77–5.28)
RDW: 13.1 % (ref 11.7–15.4)
WBC: 5 10*3/uL (ref 3.4–10.8)

## 2021-07-11 LAB — URINALYSIS, MICROSCOPIC ONLY
Bacteria, UA: NONE SEEN
Casts: NONE SEEN /lpf
RBC, Urine: NONE SEEN /hpf (ref 0–2)

## 2021-07-11 LAB — COMPREHENSIVE METABOLIC PANEL
ALT: 10 IU/L (ref 0–32)
AST: 15 IU/L (ref 0–40)
Albumin/Globulin Ratio: 1.4 (ref 1.2–2.2)
Albumin: 4 g/dL (ref 3.8–4.8)
Alkaline Phosphatase: 79 IU/L (ref 44–121)
BUN/Creatinine Ratio: 13 (ref 12–28)
BUN: 10 mg/dL (ref 8–27)
Bilirubin Total: 0.6 mg/dL (ref 0.0–1.2)
CO2: 24 mmol/L (ref 20–29)
Calcium: 9.8 mg/dL (ref 8.7–10.3)
Chloride: 103 mmol/L (ref 96–106)
Creatinine, Ser: 0.75 mg/dL (ref 0.57–1.00)
Globulin, Total: 2.9 g/dL (ref 1.5–4.5)
Glucose: 86 mg/dL (ref 70–99)
Potassium: 3.7 mmol/L (ref 3.5–5.2)
Sodium: 144 mmol/L (ref 134–144)
Total Protein: 6.9 g/dL (ref 6.0–8.5)
eGFR: 89 mL/min/{1.73_m2} (ref >59)

## 2021-07-11 LAB — VITAMIN D 25 HYDROXY (VIT D DEFICIENCY, FRACTURES): Vit D, 25-Hydroxy: 47.8 ng/mL (ref 30.0–100.0)

## 2021-07-11 LAB — TSH: TSH: 1.41 u[IU]/mL (ref 0.450–4.500)

## 2021-07-11 NOTE — Progress Notes (Signed)
CBC,CMP. TSH , urine microscopic is within normal limits. Vitamin D is within normal limits.

## 2021-07-12 ENCOUNTER — Telehealth: Payer: Self-pay

## 2021-07-12 LAB — URINE CULTURE

## 2021-07-12 NOTE — Telephone Encounter (Signed)
-----   Message from Doreen Beam, [place] sent at 07/11/2021 10:13 AM EST ----- CBC,CMP. TSH , urine microscopic is within normal limits. Vitamin D is within normal limits.

## 2021-07-12 NOTE — Progress Notes (Signed)
Mixed flor in urine, usually normal flora, no treatment needed.

## 2021-07-31 ENCOUNTER — Other Ambulatory Visit: Payer: Self-pay | Admitting: Adult Health

## 2021-08-11 ENCOUNTER — Telehealth: Payer: Self-pay | Admitting: Adult Health

## 2021-08-11 NOTE — Telephone Encounter (Signed)
Patient called in stated she was referred to [facility] and have not heard anything about appointment, Please call patient @ [phone]

## 2021-08-14 NOTE — Telephone Encounter (Signed)
Patient sees Jennifer Mcbride

## 2021-08-14 NOTE — Telephone Encounter (Signed)
Called pt and gave her number to [facility]

## 2021-08-28 ENCOUNTER — Other Ambulatory Visit: Payer: Self-pay | Admitting: Adult Health

## 2021-09-13 ENCOUNTER — Other Ambulatory Visit: Payer: Self-pay | Admitting: Adult Health

## 2021-09-25 ENCOUNTER — Encounter (INDEPENDENT_AMBULATORY_CARE_PROVIDER_SITE_OTHER): Payer: Self-pay | Admitting: Nurse Practitioner

## 2021-09-25 ENCOUNTER — Other Ambulatory Visit: Payer: Self-pay

## 2021-09-25 ENCOUNTER — Encounter: Payer: Self-pay | Admitting: Adult Health

## 2021-09-25 ENCOUNTER — Other Ambulatory Visit: Payer: Self-pay | Admitting: Adult Health

## 2021-09-25 ENCOUNTER — Ambulatory Visit (INDEPENDENT_AMBULATORY_CARE_PROVIDER_SITE_OTHER): Payer: 59 | Admitting: Nurse Practitioner

## 2021-09-25 VITALS — BP 127/82 | HR 48 | Ht 61.0 in | Wt 293.0 lb

## 2021-09-25 DIAGNOSIS — M7989 Other specified soft tissue disorders: Secondary | ICD-10-CM

## 2021-09-25 NOTE — Progress Notes (Signed)
Subjective:    Patient ID: Jennifer Mcbride, female    DOB: 18-May-1956, 66 y.o.   MRN: [id number] Chief Complaint  Patient presents with   New Patient (Initial Visit)    NP consult RLE swelling     Jennifer Mcbride 66 year old female that presents today for evaluation of right lower extremity edema.  The patient previously had noninvasive studies done as a lab only and it showed an incidental finding of reflux in the great saphenous vein in the mid thigh on the right lower extremity.  There is no evidence of DVT noted.  The patient notes that the swelling has been going on for approximately last 2 years.  It began gradually and slowly increased.  The right lower extremity is notably larger than the left.  She denies any previous history of DVTs.  She denies any traumatic episodes prior to this happening.  She denies any history of cancer or surgery.  She denies any open wound or ulceration.   Review of Systems  Cardiovascular:  Positive for leg swelling.  All other systems reviewed and are negative.     Objective:   Physical Exam Vitals reviewed.  HENT:     Head: Normocephalic.  Cardiovascular:     Rate and Rhythm: Normal rate.  Pulmonary:     Effort: Pulmonary effort is normal.  Musculoskeletal:     Right lower leg: 3+ Edema present.     Left lower leg: 1+ Edema present.  Neurological:     Mental Status: She is alert and oriented to person, place, and time.  Psychiatric:        Mood and Affect: Mood normal.        Behavior: Behavior normal.        Thought Content: Thought content normal.        Judgment: Judgment normal.    BP 127/82    Pulse (!) 48    Ht 5\' 1"  (1.549 m)    Wt 293 lb (132.9 kg)    BMI 55.36 kg/m   Past Medical History:  Diagnosis Date   Anemia    IDA   Hypertension    Obesity    Urine incontinence    Vitamin D deficiency     Social History   Socioeconomic History   Marital status: Divorced    Spouse name: Not on file   Number of children:  Not on file   Years of education: Not on file   Highest education level: Not on file  Occupational History   Not on file  Tobacco Use   Smoking status: Never   Smokeless tobacco: Never  Vaping Use   Vaping Use: Never used  Substance and Sexual Activity   Alcohol use: No   Drug use: Never   Sexual activity: Not Currently  Other Topics Concern   Not on file  Social History Narrative   Not on file   Social Determinants of Health   Financial Resource Strain: Not on file  Food Insecurity: Not on file  Transportation Needs: Not on file  Physical Activity: Not on file  Stress: Not on file  Social Connections: Not on file  Intimate Partner Violence: Not on file    Past Surgical History:  Procedure Laterality Date   COLONOSCOPY WITH PROPOFOL     COLONOSCOPY WITH PROPOFOL N/A 03/28/2020   Procedure: COLONOSCOPY WITH PROPOFOL;  Surgeon: [name], MD;  Location: [facility] ENDOSCOPY;  Service: Endoscopy;  Laterality: N/A;   TUBAL LIGATION  Family History  Problem Relation Age of Onset   Hypertension Mother    Hearing loss Mother    Depression Mother    Arthritis Mother    Kidney disease Father    Hearing loss Father    Diabetes Father    Mental illness Sister    Early death Sister    Mental illness Sister    Kidney disease Sister    Early death Sister    Diabetes Sister    Mental illness Sister    Mental illness Brother    Arthritis Brother    Kidney disease Brother    Diabetes Brother    Kidney disease Brother    Diabetes Brother    Heart disease Son    Diabetes Son    Breast cancer Neg Hx     No Known Allergies  CBC Latest Ref Rng & Units 07/10/2021 01/17/2021  WBC 3.4 - 10.8 x10E3/uL 5.0 7.1  Hemoglobin 11.1 - 15.9 g/dL 12.1 12.3  Hematocrit 34.0 - 46.6 % 36.3 37.9  Platelets 150 - 450 x10E3/uL 272 316      CMP     Component Value Date/Time   NA 144 07/10/2021 0812   K 3.7 07/10/2021 0812   CL 103 07/10/2021 0812   CO2 24 07/10/2021 0812    GLUCOSE 86 07/10/2021 0812   BUN 10 07/10/2021 0812   CREATININE 0.75 07/10/2021 0812   CALCIUM 9.8 07/10/2021 0812   PROT 6.9 07/10/2021 0812   ALBUMIN 4.0 07/10/2021 0812   AST 15 07/10/2021 0812   ALT 10 07/10/2021 0812   ALKPHOS 79 07/10/2021 0812   BILITOT 0.6 07/10/2021 0812     No results found.     Assessment & Plan:   1. Right leg swelling The patient does have evidence of reflux in her right lower extremity however given the swelling that she has it is not necessarily consistent with the reflux studies.  Also the study was nearly a year ago.  We will have the patient utilize medical grade compression stocking and she was given a prescription today.  She is advised to wear these daily.  She should also elevate her lower extremities and be active when possible.  We will have her return in 3 months to reevaluate noninvasive studies.  I suspect that the patient may have more of a lymphedema component ongoing as well.  If her reflux studies are nonrevealing we may discuss further noninvasive studies to look into the cause of swelling.    Current Outpatient Medications on File Prior to Visit  Medication Sig Dispense Refill   bisoprolol-hydrochlorothiazide (ZIAC) 5-6.25 MG tablet Take 1 tablet by mouth daily. 90 tablet 1   Multiple Vitamin (MULTIVITAMIN) tablet Take 1 tablet by mouth daily.     Vitamin D, Ergocalciferol, (DRISDOL) 1.25 MG (50000 UNIT) CAPS capsule Take 1 capsule (50,000 Units total) by mouth once a week. 10 capsule 0   No current facility-administered medications on file prior to visit.    There are no Patient Instructions on file for this visit. No follow-ups on file.   Kris Hartmann, NP

## 2021-09-27 NOTE — Telephone Encounter (Signed)
I believe this was routed to me by accident routing to [facility] for follow up.

## 2021-12-22 ENCOUNTER — Other Ambulatory Visit (INDEPENDENT_AMBULATORY_CARE_PROVIDER_SITE_OTHER): Payer: Self-pay | Admitting: Nurse Practitioner

## 2021-12-22 DIAGNOSIS — M7989 Other specified soft tissue disorders: Secondary | ICD-10-CM

## 2021-12-25 ENCOUNTER — Encounter (INDEPENDENT_AMBULATORY_CARE_PROVIDER_SITE_OTHER): Payer: 59

## 2021-12-25 ENCOUNTER — Ambulatory Visit (INDEPENDENT_AMBULATORY_CARE_PROVIDER_SITE_OTHER): Payer: 59 | Admitting: Nurse Practitioner

## 2022-01-08 ENCOUNTER — Ambulatory Visit: Payer: 59 | Admitting: Adult Health

## 2022-03-14 ENCOUNTER — Encounter: Payer: Self-pay | Admitting: Nurse Practitioner

## 2022-03-14 ENCOUNTER — Ambulatory Visit: Payer: 59 | Admitting: Nurse Practitioner

## 2022-03-14 VITALS — BP 120/70 | HR 79 | Temp 98.0°F | Resp 16 | Ht 61.0 in | Wt 294.0 lb

## 2022-03-14 DIAGNOSIS — Z6841 Body Mass Index (BMI) 40.0 and over, adult: Secondary | ICD-10-CM

## 2022-03-14 DIAGNOSIS — I10 Essential (primary) hypertension: Secondary | ICD-10-CM | POA: Diagnosis not present

## 2022-03-14 DIAGNOSIS — M7989 Other specified soft tissue disorders: Secondary | ICD-10-CM

## 2022-03-14 DIAGNOSIS — E7849 Other hyperlipidemia: Secondary | ICD-10-CM

## 2022-03-14 DIAGNOSIS — E559 Vitamin D deficiency, unspecified: Secondary | ICD-10-CM

## 2022-03-14 DIAGNOSIS — Z131 Encounter for screening for diabetes mellitus: Secondary | ICD-10-CM

## 2022-03-14 DIAGNOSIS — Z23 Encounter for immunization: Secondary | ICD-10-CM

## 2022-03-14 DIAGNOSIS — D508 Other iron deficiency anemias: Secondary | ICD-10-CM

## 2022-03-14 DIAGNOSIS — Z7689 Persons encountering health services in other specified circumstances: Secondary | ICD-10-CM

## 2022-03-14 MED ORDER — SHINGRIX 50 MCG/0.5ML IM SUSR: 0.5000 mL | Freq: Once | INTRAMUSCULAR | 0 refills | Status: AC

## 2022-03-14 MED ORDER — BISOPROLOL-HYDROCHLOROTHIAZIDE 5-6.25 MG PO TABS: 1.0000 | ORAL_TABLET | Freq: Every day | ORAL | 1 refills | Status: DC

## 2022-03-14 NOTE — Progress Notes (Signed)
BP 120/70   Pulse 79   Temp 98 F (36.7 C) (Oral)   Resp 16   Ht 5' 1"  (1.549 m)   Wt 294 lb (133.4 kg)   SpO2 98%   BMI 55.55 kg/m    Subjective:    Patient ID: Jennifer Mcbride, female    DOB: May 17, 1956, 66 y.o.   MRN: [id number]  HPI: Jennifer Mcbride is a 66 y.o. female  Chief Complaint  Patient presents with   Establish Care    Pt would like testing done for vitamin D.   Establish care: Her last physical was two years ago. She is up-to-date on her colonoscopy and mammogram.  She says she just received a letter to schedule her mammogram. Her last Pap was 5 years ago.  Hypertension: Her blood pressure today is 120/70.  She currently takes bisoprolol-hydrochlorothiazide 5-6.25 mg daily.  She denies any chest pain, shortness of breath, headaches or blurred vision.  Vitamin D deficiency: Last vitamin D was 47.8 on 07/10/2021.  Patient would like to have that checked again today.  Patient is talking OTC  vitamin D supplementation.  Hyperlipidemia: Her last LDL was 115 on 01/17/2021.  She is not currently on cholesterol-lowering medication.  Get labs today. The 10-year ASCVD risk score (Arnett DK, et al., 2019) is: 6.8%   Values used to calculate the score:     Age: 22 years     Sex: Female     Is Non-Hispanic African American: Yes     Diabetic: No     Tobacco smoker: No     Systolic Blood Pressure: 062 mmHg     Is BP treated: Yes     HDL Cholesterol: 69 mg/dL     Total Cholesterol: 197 mg/dL   Obesity: Today's weight she is 294 pounds with a BMI 55.55. She is going to work on increasing her walking.   Right leg swelling: Patient recently saw vascular surgery on 20 2023.  It was recommended that she use compression socks.  Patient has not been using them lately.  But she is going to and that she is supposed to follow-up with them.  According to vascular note she does have evidence of reflux in her right lower extremity they gave her medical grade compression stocking  prescription.  She was advised to wear these daily.  And to elevate her lower extremities whenever possible.  Patient is to follow-up with them in 3 months for reevaluation of noninvasive studies.  They also suspected the patient may have more of a lymphedema component going on as well.  Relevant past medical, surgical, family and social history reviewed and updated as indicated. Interim medical history since our last visit reviewed. Allergies and medications reviewed and updated.  Review of Systems  Constitutional: Negative for fever or weight change.  Respiratory: Negative for cough and shortness of breath.   Cardiovascular: Negative for chest pain or palpitations.  Gastrointestinal: Negative for abdominal pain, no bowel changes.  Musculoskeletal: Negative for gait problem or joint swelling.  Skin: Negative for rash.  Neurological: Negative for dizziness or headache.  No other specific complaints in a complete review of systems (except as listed in HPI above).      Objective:    BP 120/70   Pulse 79   Temp 98 F (36.7 C) (Oral)   Resp 16   Ht 5' 1"  (1.549 m)   Wt 294 lb (133.4 kg)   SpO2 98%   BMI 55.55 kg/m  Wt Readings from Last 3 Encounters:  03/14/22 294 lb (133.4 kg)  09/25/21 293 lb (132.9 kg)  07/10/21 291 lb 12.8 oz (132.4 kg)    Physical Exam  Constitutional: Patient appears well-developed and well-nourished. Obese  No distress.  HEENT: head atraumatic, normocephalic, pupils equal and reactive to light,  neck supple Cardiovascular: Normal rate, regular rhythm and normal heart sounds.  No murmur heard. No BLE edema. Pulmonary/Chest: Effort normal and breath sounds normal. No respiratory distress. Abdominal: Soft.  There is no tenderness. Lower extremities: 3+ edema right lower leg 1+ edema left lower leg Psychiatric: Patient has a normal mood and affect. behavior is normal. Judgment and thought content normal.  Results for orders placed or performed in visit on  07/10/21  Urine Culture   Specimen: Blood   BLD  Result Value Ref Range   Urine Culture, Routine Final report    Organism ID, Bacteria Comment   CBC with Differential/Platelet  Result Value Ref Range   WBC 5.0 3.4 - 10.8 x10E3/uL   RBC 4.14 3.77 - 5.28 x10E6/uL   Hemoglobin 12.1 11.1 - 15.9 g/dL   Hematocrit 36.3 34.0 - 46.6 %   MCV 88 79 - 97 fL   MCH 29.2 26.6 - 33.0 pg   MCHC 33.3 31.5 - 35.7 g/dL   RDW 13.1 11.7 - 15.4 %   Platelets 272 150 - 450 x10E3/uL   Neutrophils 65 Not Estab. %   Lymphs 22 Not Estab. %   Monocytes 9 Not Estab. %   Eos 3 Not Estab. %   Basos 1 Not Estab. %   Neutrophils Absolute 3.3 1.4 - 7.0 x10E3/uL   Lymphocytes Absolute 1.1 0.7 - 3.1 x10E3/uL   Monocytes Absolute 0.5 0.1 - 0.9 x10E3/uL   EOS (ABSOLUTE) 0.1 0.0 - 0.4 x10E3/uL   Basophils Absolute 0.1 0.0 - 0.2 x10E3/uL   Immature Granulocytes 0 Not Estab. %   Immature Grans (Abs) 0.0 0.0 - 0.1 x10E3/uL  Comprehensive metabolic panel  Result Value Ref Range   Glucose 86 70 - 99 mg/dL   BUN 10 8 - 27 mg/dL   Creatinine, Ser 0.75 0.57 - 1.00 mg/dL   eGFR 89 >59 mL/min/1.73   BUN/Creatinine Ratio 13 12 - 28   Sodium 144 134 - 144 mmol/L   Potassium 3.7 3.5 - 5.2 mmol/L   Chloride 103 96 - 106 mmol/L   CO2 24 20 - 29 mmol/L   Calcium 9.8 8.7 - 10.3 mg/dL   Total Protein 6.9 6.0 - 8.5 g/dL   Albumin 4.0 3.8 - 4.8 g/dL   Globulin, Total 2.9 1.5 - 4.5 g/dL   Albumin/Globulin Ratio 1.4 1.2 - 2.2   Bilirubin Total 0.6 0.0 - 1.2 mg/dL   Alkaline Phosphatase 79 44 - 121 IU/L   AST 15 0 - 40 IU/L   ALT 10 0 - 32 IU/L  TSH  Result Value Ref Range   TSH 1.410 0.450 - 4.500 uIU/mL  Urine Microscopic Only  Result Value Ref Range   WBC, UA 0-5 0 - 5 /hpf   RBC, Urine None seen 0 - 2 /hpf   Epithelial Cells (non renal) 0-10 0 - 10 /hpf   Casts None seen None seen /lpf   Bacteria, UA None seen None seen/Few  VITAMIN D 25 Hydroxy (Vit-D Deficiency, Fractures)  Result Value Ref Range   Vit D,  25-Hydroxy 47.8 30.0 - 100.0 ng/mL      Assessment & Plan:   Problem List  Items Addressed This Visit       Cardiovascular and Mediastinum   Hypertension - Primary    Pressure at goal today 120/70.  Continue taking bisoprolol-hydrochlorothiazide 5-6.25 mg daily      Relevant Medications   bisoprolol-hydrochlorothiazide (ZIAC) 5-6.25 MG tablet   Other Relevant Orders   CBC with Differential/Platelet   Comprehensive metabolic panel     Other   Vitamin D deficiency    Patient is currently taking over-the-counter vitamin D supplementation.  We will get labs today.      Relevant Orders   VITAMIN D 25 Hydroxy (Vit-D Deficiency, Fractures)   Iron deficiency anemia secondary to inadequate dietary iron intake   Relevant Orders   CBC with Differential/Platelet   Morbid obesity with BMI of 50.0-59.9, adult ([place])    Work on increasing physical activity as tolerated.      Relevant Orders   CBC with Differential/Platelet   Comprehensive metabolic panel   Lipid panel   Hemoglobin A1c   Pure hypercholesterolemia    Patient's last LDL was 115 on 01/17/2021.  We will get labs today.      Relevant Medications   bisoprolol-hydrochlorothiazide (ZIAC) 5-6.25 MG tablet   Need for shingles vaccine   Relevant Medications   Zoster Vaccine Adjuvanted [facility]) injection   Right leg swelling    Wear compression stockings as prescribed by vascular.  Elevate legs when can.  Follow-up with vascular.      Other Visit Diagnoses     Screening for diabetes mellitus       Relevant Orders   Hemoglobin A1c   Encounter to establish care       Schedule CPE        Follow up plan: Return in about 6 months (around 09/14/2022) for cpe.

## 2022-03-14 NOTE — Assessment & Plan Note (Signed)
Patient is currently taking over-the-counter vitamin D supplementation.  We will get labs today.

## 2022-03-14 NOTE — Assessment & Plan Note (Signed)
Patient's last LDL was 115 on 01/17/2021.  We will get labs today.

## 2022-03-14 NOTE — Assessment & Plan Note (Signed)
Wear compression stockings as prescribed by vascular.  Elevate legs when can.  Follow-up with vascular.

## 2022-03-14 NOTE — Assessment & Plan Note (Signed)
Pressure at goal today 120/70.  Continue taking bisoprolol-hydrochlorothiazide 5-6.25 mg daily

## 2022-03-14 NOTE — Assessment & Plan Note (Signed)
Work on increasing physical activity as tolerated.

## 2022-03-15 LAB — CBC WITH DIFFERENTIAL/PLATELET
Basophils Absolute: 0.1 10*3/uL (ref 0.0–0.2)
Basos: 1 %
EOS (ABSOLUTE): 0.1 10*3/uL (ref 0.0–0.4)
Eos: 2 %
Hematocrit: 37.9 % (ref 34.0–46.6)
Hemoglobin: 12.5 g/dL (ref 11.1–15.9)
Immature Grans (Abs): 0 10*3/uL (ref 0.0–0.1)
Immature Granulocytes: 0 %
Lymphocytes Absolute: 1.5 10*3/uL (ref 0.7–3.1)
Lymphs: 19 %
MCH: 29.6 pg (ref 26.6–33.0)
MCHC: 33 g/dL (ref 31.5–35.7)
MCV: 90 fL (ref 79–97)
Monocytes Absolute: 0.6 10*3/uL (ref 0.1–0.9)
Monocytes: 8 %
Neutrophils Absolute: 5.4 10*3/uL (ref 1.4–7.0)
Neutrophils: 70 %
Platelets: 300 10*3/uL (ref 150–450)
RBC: 4.22 x10E6/uL (ref 3.77–5.28)
RDW: 13 % (ref 11.7–15.4)
WBC: 7.7 10*3/uL (ref 3.4–10.8)

## 2022-03-15 LAB — HEMOGLOBIN A1C
Est. average glucose Bld gHb Est-mCnc: 111 mg/dL
Hgb A1c MFr Bld: 5.5 % (ref 4.8–5.6)

## 2022-03-15 LAB — LIPID PANEL
Chol/HDL Ratio: 2.6 ratio (ref 0.0–4.4)
Cholesterol, Total: 184 mg/dL (ref 100–199)
HDL: 71 mg/dL (ref >39)
LDL Chol Calc (NIH): 102 mg/dL — ABNORMAL HIGH (ref 0–99)
Triglycerides: 58 mg/dL (ref 0–149)
VLDL Cholesterol Cal: 11 mg/dL (ref 5–40)

## 2022-03-15 LAB — COMPREHENSIVE METABOLIC PANEL
ALT: 13 IU/L (ref 0–32)
AST: 17 IU/L (ref 0–40)
Albumin/Globulin Ratio: 1.7 (ref 1.2–2.2)
Albumin: 4.7 g/dL (ref 3.9–4.9)
Alkaline Phosphatase: 82 IU/L (ref 44–121)
BUN/Creatinine Ratio: 15 (ref 12–28)
BUN: 13 mg/dL (ref 8–27)
Bilirubin Total: 0.5 mg/dL (ref 0.0–1.2)
CO2: 22 mmol/L (ref 20–29)
Calcium: 10.5 mg/dL — ABNORMAL HIGH (ref 8.7–10.3)
Chloride: 101 mmol/L (ref 96–106)
Creatinine, Ser: 0.84 mg/dL (ref 0.57–1.00)
Globulin, Total: 2.7 g/dL (ref 1.5–4.5)
Glucose: 81 mg/dL (ref 70–99)
Potassium: 4.2 mmol/L (ref 3.5–5.2)
Sodium: 140 mmol/L (ref 134–144)
Total Protein: 7.4 g/dL (ref 6.0–8.5)
eGFR: 78 mL/min/{1.73_m2} (ref >59)

## 2022-03-15 LAB — VITAMIN D 25 HYDROXY (VIT D DEFICIENCY, FRACTURES): Vit D, 25-Hydroxy: 49 ng/mL (ref 30.0–100.0)

## 2022-06-04 ENCOUNTER — Encounter (INDEPENDENT_AMBULATORY_CARE_PROVIDER_SITE_OTHER): Payer: Self-pay

## 2022-08-07 IMAGING — MG DIGITAL SCREENING BILAT W/ TOMO W/ CAD
6 of 10 series · 6 of 30 positions shown · non-contrast
Comparison: Previous exam(s).

ACR Breast Density Category a: The breast tissue is almost entirely
fatty.

CLINICAL DATA: Screening.

EXAM:
DIGITAL SCREENING BILATERAL MAMMOGRAM WITH TOMO AND CAD

[L MLO synth-2D]
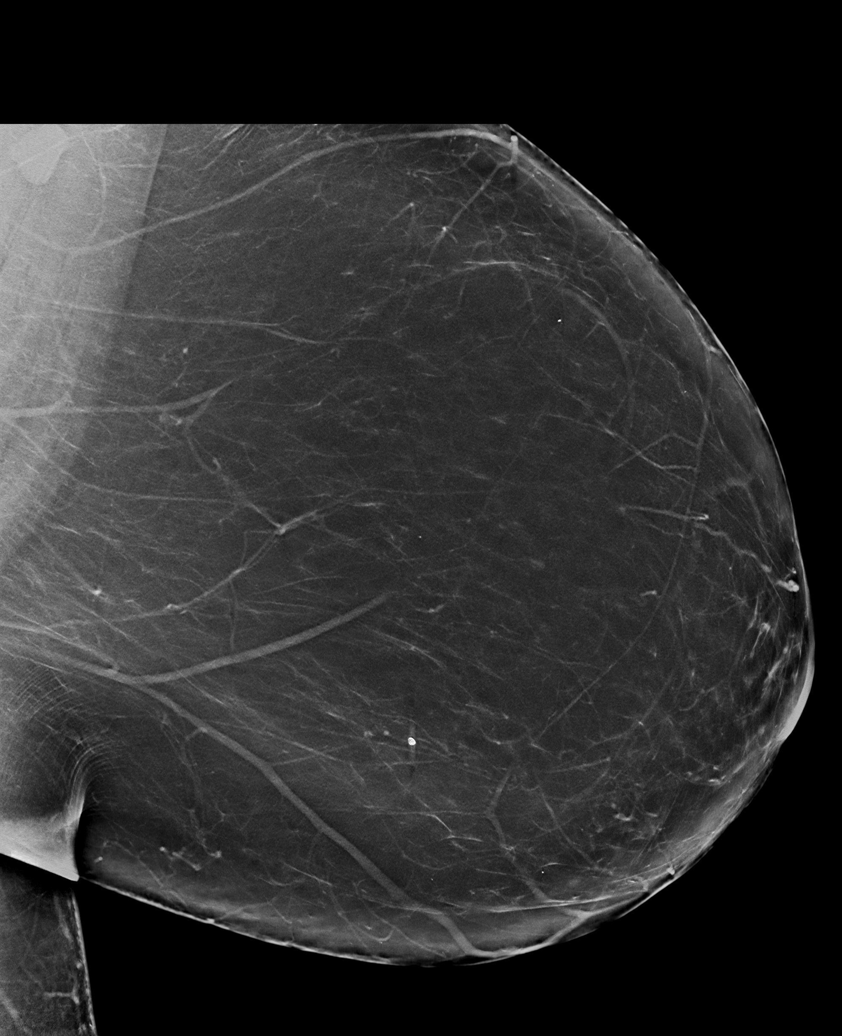

[R MLO synth-2D (1 of 2)]
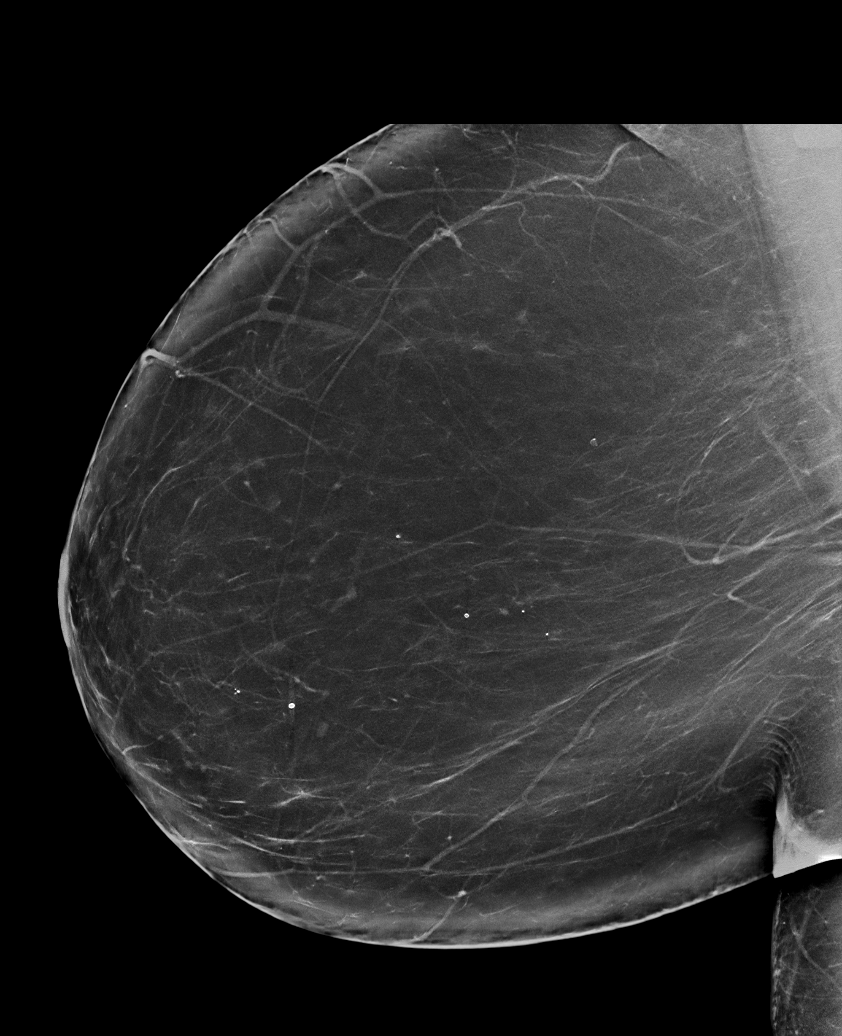

[R MLO synth-2D (2 of 2)]
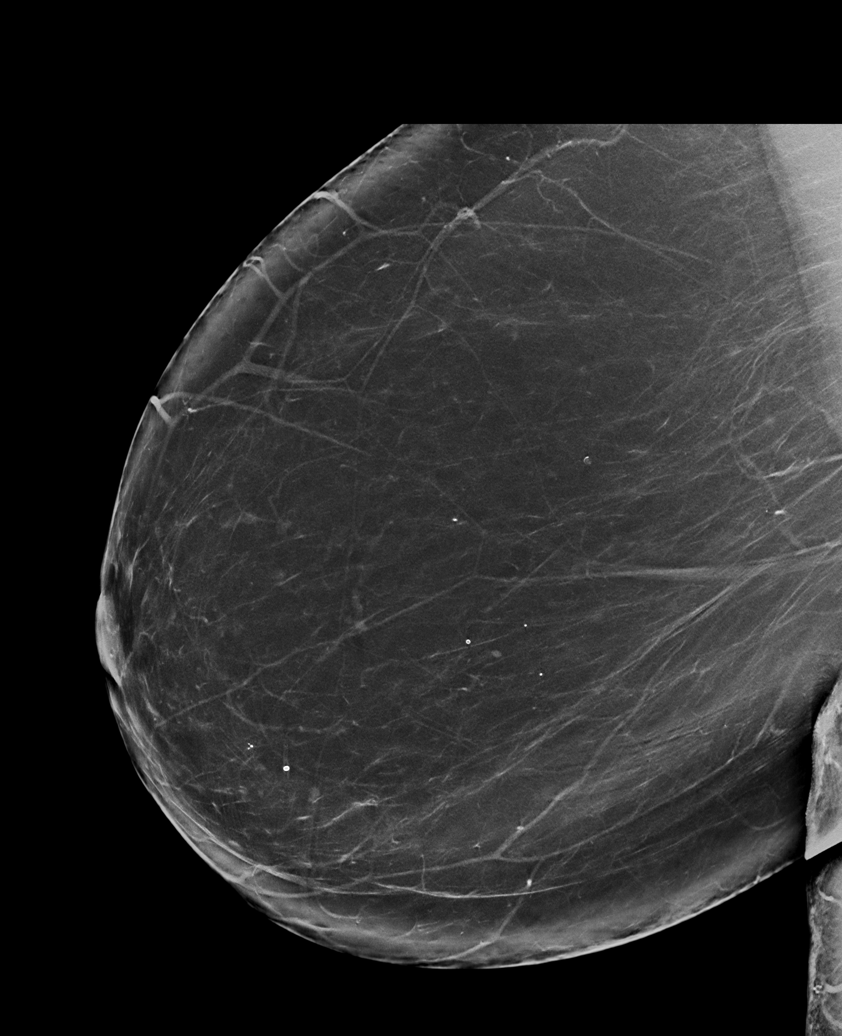

[L CC synth-2D]
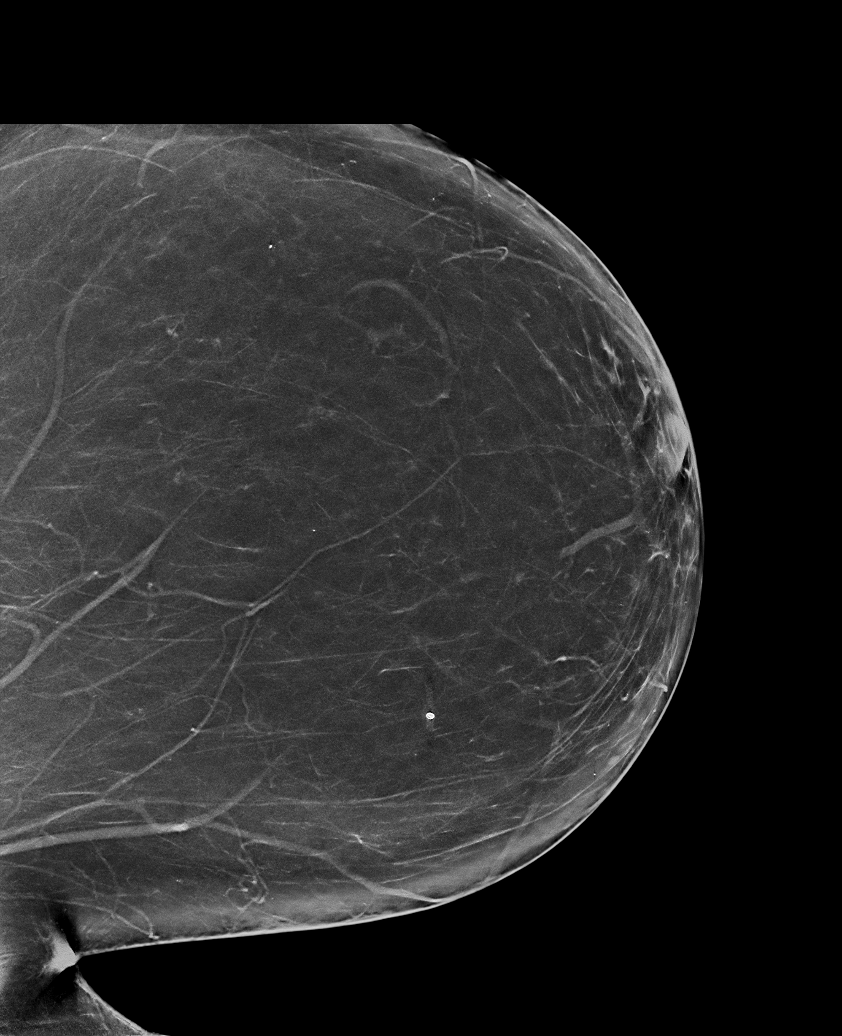

[R CC synth-2D]
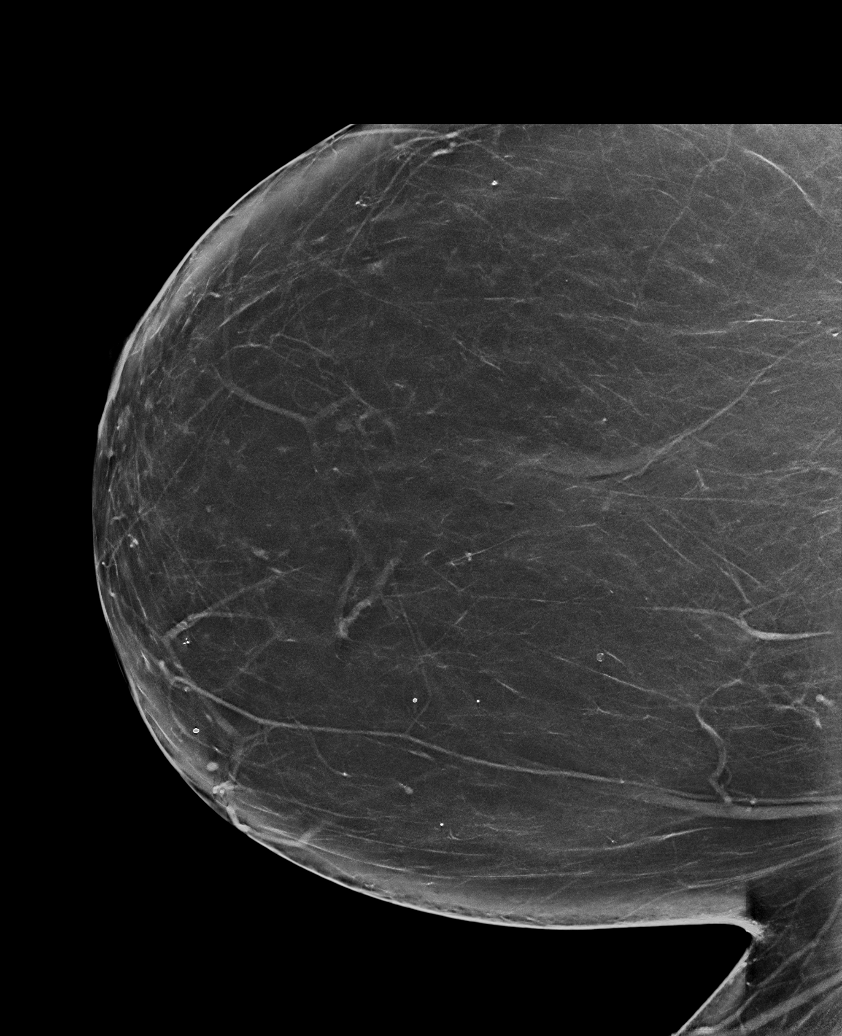

[R CC tomo · tomo slice 39/77.0]
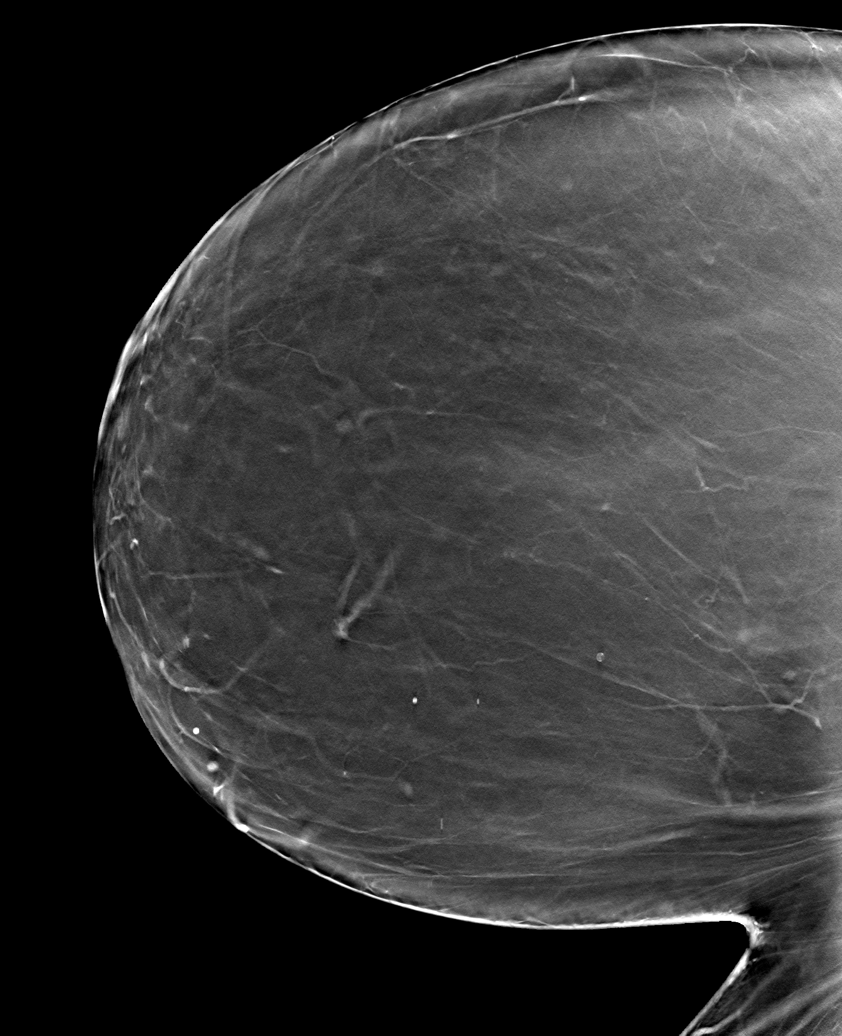

[6 of 30 positions shown; findings below may reference images not displayed]

FINDINGS: There are no findings suspicious for malignancy. Images were
processed with CAD.
IMPRESSION: No mammographic evidence of malignancy. A result letter of this
screening mammogram will be mailed directly to the patient.

RECOMMENDATION:
Screening mammogram in one year. (Code:8Y-Q-VVS)

BI-RADS CATEGORY  1: Negative.

## 2022-08-24 ENCOUNTER — Encounter: Payer: Self-pay | Admitting: Nurse Practitioner

## 2022-08-24 ENCOUNTER — Other Ambulatory Visit: Payer: Self-pay | Admitting: Physician Assistant

## 2022-08-24 DIAGNOSIS — N3941 Urge incontinence: Secondary | ICD-10-CM

## 2022-09-14 DIAGNOSIS — E7849 Other hyperlipidemia: Secondary | ICD-10-CM | POA: Insufficient documentation

## 2022-09-14 NOTE — Progress Notes (Unsigned)
Name: Jennifer Mcbride   MRN: [id number]    DOB: [birth year]   Date:09/14/2022       Progress Note  Subjective  Chief Complaint  No chief complaint on file.   HPI  Patient presents for annual CPE.  Diet: *** Exercise: ***  Sleep: *** Last dental exam:*** Last eye exam: ***  [place] [place] Office Visit from 03/14/2022 in [facility]  AUDIT-C Score 0      Depression: Phq 9 is  {Desc; negative/positive:13464}    03/14/2022    8:14 AM 01/17/2021    8:59 AM 10/22/2016   10:54 AM  Depression screen PHQ 2/9  Decreased Interest 0 0 0  Down, Depressed, Hopeless 0 0 0  PHQ - 2 Score 0 0 0  Altered sleeping 0 1   Tired, decreased energy 0 1   Change in appetite 0 1   Feeling bad or failure about yourself  0 0   Trouble concentrating 0 0   Moving slowly or fidgety/restless 0 0   Suicidal thoughts 0 0   PHQ-9 Score 0 3   Difficult doing work/chores Not difficult at all Not difficult at all    Hypertension: BP Readings from Last 3 Encounters:  03/14/22 120/70  09/25/21 127/82  07/10/21 124/82   Obesity: Wt Readings from Last 3 Encounters:  03/14/22 294 lb (133.4 kg)  09/25/21 293 lb (132.9 kg)  07/10/21 291 lb 12.8 oz (132.4 kg)   BMI Readings from Last 3 Encounters:  03/14/22 55.55 kg/m  09/25/21 55.36 kg/m  07/10/21 53.37 kg/m     Vaccines:  HPV: up to at age 64 , ask insurance if age between 71-45  Shingrix: 77-64 yo and ask insurance if covered when patient above 4 yo Pneumonia:  educated and discussed with patient. Flu:  educated and discussed with patient.  Hep C Screening: due STD testing and prevention (HIV/chl/gon/syphilis): due Intimate partner violence:*** Sexual History : Menstrual History/LMP/Abnormal Bleeding:  Incontinence Symptoms:   Breast cancer:  - Last Mammogram: due - BRCA gene screening: none  Osteoporosis: Discussed high calcium and vitamin D supplementation, weight bearing exercises  Cervical cancer  screening: due  Skin cancer: Discussed monitoring for atypical lesions  Colorectal cancer: 03/28/2020   Lung cancer:   Low Dose CT Chest recommended if Age 31-80 years, 20 pack-year currently smoking OR have quit w/in 15years. Patient does not qualify.   ECG: none  Advanced Care Planning: A voluntary discussion about advance care planning including the explanation and discussion of advance directives.  Discussed health care proxy and Living will, and the patient was able to identify a health care proxy as ***.  Patient {DOES_DOES NF:[id number] have a living will at present time. If patient does have living will, I have requested they bring this to the clinic to be scanned in to their chart.  Lipids: Lab Results  Component Value Date   CHOL 184 03/14/2022   CHOL 197 01/17/2021   Lab Results  Component Value Date   HDL 71 03/14/2022   HDL 69 01/17/2021   Lab Results  Component Value Date   LDLCALC 102 (H) 03/14/2022   LDLCALC 115 (H) 01/17/2021   Lab Results  Component Value Date   TRIG 58 03/14/2022   TRIG 72 01/17/2021   Lab Results  Component Value Date   CHOLHDL 2.6 03/14/2022   CHOLHDL 2.9 01/17/2021   No results found for: "LDLDIRECT"  Glucose: Glucose  Date Value Ref Range Status  03/14/2022  81 70 - 99 mg/dL Final  07/10/2021 86 70 - 99 mg/dL Final  01/17/2021 104 (H) 65 - 99 mg/dL Final    Patient Active Problem List   Diagnosis Date Noted   Family history of colonic polyps 01/17/2021   Uterine leiomyoma 01/17/2021   Need for shingles vaccine 01/17/2021   Right leg swelling 01/17/2021   Urinary incontinence 01/17/2021   Stress incontinence 01/17/2021   Encounter for imaging of bilateral greater saphenous veins 01/17/2021   OSA (obstructive sleep apnea) 01/17/2021   Pure hypercholesterolemia 11/24/2019   COVID-19 virus detected 09/23/2019   Morbid obesity with BMI of 50.0-59.9, adult ([place]) 04/25/2018   Hypertension 04/11/2017   Colon polyp 10/22/2016    Obesity 10/22/2016   Vitamin D deficiency 10/22/2016   Iron deficiency anemia secondary to inadequate dietary iron intake 10/22/2016    Past Surgical History:  Procedure Laterality Date   COLONOSCOPY WITH PROPOFOL     COLONOSCOPY WITH PROPOFOL N/A 03/28/2020   Procedure: COLONOSCOPY WITH PROPOFOL;  Surgeon: [name], MD;  Location: [facility] ENDOSCOPY;  Service: Endoscopy;  Laterality: N/A;   TUBAL LIGATION      Family History  Problem Relation Age of Onset   Hypertension Mother    Hearing loss Mother    Depression Mother    Arthritis Mother    Kidney disease Father    Hearing loss Father    Diabetes Father    Mental illness Sister    Early death Sister    Mental illness Sister    Kidney disease Sister    Early death Sister    Diabetes Sister    Mental illness Sister    Mental illness Brother    Arthritis Brother    Kidney disease Brother    Diabetes Brother    Kidney disease Brother    Diabetes Brother    Heart disease Son    Diabetes Son    Breast cancer Neg Hx     Social History   Socioeconomic History   Marital status: Divorced    Spouse name: Not on file   Number of children: Not on file   Years of education: Not on file   Highest education level: Not on file  Occupational History   Not on file  Tobacco Use   Smoking status: Never   Smokeless tobacco: Never  Vaping Use   Vaping Use: Never used  Substance and Sexual Activity   Alcohol use: No   Drug use: Never   Sexual activity: Not Currently  Other Topics Concern   Not on file  Social History Narrative   Not on file   Social Determinants of Health   Financial Resource Strain: Low Risk  (03/14/2022)   Overall Financial Resource Strain (CARDIA)    Difficulty of Paying Living Expenses: Not hard at all  Food Insecurity: No Food Insecurity (03/14/2022)   Hunger Vital Sign    Worried About Running Out of Food in the Last Year: Never true    Ran Out of Food in the Last Year: Never true   Transportation Needs: No Transportation Needs (03/14/2022)   PRAPARE - Hydrologist (Medical): No    Lack of Transportation (Non-Medical): No  Physical Activity: Inactive (03/14/2022)   Exercise Vital Sign    Days of Exercise per Week: 0 days    Minutes of Exercise per Session: 0 min  Stress: No Stress Concern Present (03/14/2022)   [place]  Stress Questionnaire    Feeling of Stress : Only a little  Social Connections: Socially Isolated (03/14/2022)   Social Connection and Isolation Panel [NHANES]    Frequency of Communication with Friends and Family: Three times a week    Frequency of Social Gatherings with Friends and Family: Three times a week    Attends Religious Services: Never    Active Member of Clubs or Organizations: No    Attends Archivist Meetings: Never    Marital Status: Divorced  Human resources officer Violence: Not At Risk (03/14/2022)   Humiliation, Afraid, Rape, and Kick questionnaire    Fear of Current or Ex-Partner: No    Emotionally Abused: No    Physically Abused: No    Sexually Abused: No     Current Outpatient Medications:    bisoprolol-hydrochlorothiazide (ZIAC) 5-6.25 MG tablet, Take 1 tablet by mouth daily., Disp: 90 tablet, Rfl: 1   Multiple Vitamin (MULTIVITAMIN) tablet, Take 1 tablet by mouth daily., Disp: , Rfl:   No Known Allergies   ROS  Constitutional: Negative for fever or weight change.  Respiratory: Negative for cough and shortness of breath.   Cardiovascular: Negative for chest pain or palpitations.  Gastrointestinal: Negative for abdominal pain, no bowel changes.  Musculoskeletal: Negative for gait problem or joint swelling.  Skin: Negative for rash.  Neurological: Negative for dizziness or headache.  No other specific complaints in a complete review of systems (except as listed in HPI above).   Objective  There were no vitals filed for this visit.  There is  no height or weight on file to calculate BMI.  Physical Exam Constitutional: Patient appears well-developed and well-nourished. No distress.  HENT: Head: Normocephalic and atraumatic. Ears: B TMs ok, no erythema or effusion; Nose: Nose normal. Mouth/Throat: Oropharynx is clear and moist. No oropharyngeal exudate.  Eyes: Conjunctivae and EOM are normal. Pupils are equal, round, and reactive to light. No scleral icterus.  Neck: Normal range of motion. Neck supple. No JVD present. No thyromegaly present.  Cardiovascular: Normal rate, regular rhythm and normal heart sounds.  No murmur heard. No BLE edema. Pulmonary/Chest: Effort normal and breath sounds normal. No respiratory distress. Abdominal: Soft. Bowel sounds are normal, no distension. There is no tenderness. no masses Breast: no lumps or masses, no nipple discharge or rashes FEMALE GENITALIA:  External genitalia normal External urethra normal Vaginal vault normal without discharge or lesions Cervix normal without discharge or lesions Bimanual exam normal without masses RECTAL: no rectal masses or hemorrhoids Musculoskeletal: Normal range of motion, no joint effusions. No gross deformities Neurological: he is alert and oriented to person, place, and time. No cranial nerve deficit. Coordination, balance, strength, speech and gait are normal.  Skin: Skin is warm and dry. No rash noted. No erythema.  Psychiatric: Patient has a normal mood and affect. behavior is normal. Judgment and thought content normal.   No results found for this or any previous visit (from the past 2160 hour(s)).    Fall Risk:    03/14/2022    8:13 AM 01/17/2021    8:58 AM 10/22/2016   10:54 AM  Fall Risk   Falls in the past year? 0 0 No  Number falls in past yr: 0 0   Injury with Fall? 0 0   Risk for fall due to : No Fall Risks    Follow up Falls prevention discussed;Education provided Falls evaluation completed    ***  Functional Status Survey:    ***  Assessment &  Plan  There are no diagnoses linked to this encounter.  -USPSTF grade A and B recommendations reviewed with patient; age-appropriate recommendations, preventive care, screening tests, etc discussed and encouraged; healthy living encouraged; see AVS for patient education given to patient -Discussed importance of 150 minutes of physical activity weekly, eat two servings of fish weekly, eat one serving of tree nuts ( cashews, pistachios, pecans, almonds.Marland Kitchen) every other day, eat 6 servings of fruit/vegetables daily and drink plenty of water and avoid sweet beverages.   -Reviewed Health Maintenance: Health maintenance: she is due for mammogram, dexa scan, pap and labs.

## 2022-09-17 ENCOUNTER — Encounter: Payer: Self-pay | Admitting: Nurse Practitioner

## 2022-09-17 ENCOUNTER — Other Ambulatory Visit (HOSPITAL_COMMUNITY)
Admission: RE | Admit: 2022-09-17 | Discharge: 2022-09-17 | Disposition: A | Discharge status: Home / Self Care | Payer: 59 | Source: Ambulatory Visit | Attending: Nurse Practitioner | Admitting: Nurse Practitioner

## 2022-09-17 ENCOUNTER — Other Ambulatory Visit: Payer: Self-pay

## 2022-09-17 ENCOUNTER — Ambulatory Visit (INDEPENDENT_AMBULATORY_CARE_PROVIDER_SITE_OTHER): Payer: 59 | Admitting: Nurse Practitioner

## 2022-09-17 VITALS — BP 132/84 | HR 61 | Temp 97.8°F | Resp 16 | Ht 62.0 in | Wt 290.7 lb

## 2022-09-17 DIAGNOSIS — Z13820 Encounter for screening for osteoporosis: Secondary | ICD-10-CM

## 2022-09-17 DIAGNOSIS — Z0000 Encounter for general adult medical examination without abnormal findings: Secondary | ICD-10-CM | POA: Diagnosis present

## 2022-09-17 DIAGNOSIS — Z114 Encounter for screening for human immunodeficiency virus [HIV]: Secondary | ICD-10-CM | POA: Diagnosis not present

## 2022-09-17 DIAGNOSIS — Z124 Encounter for screening for malignant neoplasm of cervix: Secondary | ICD-10-CM | POA: Insufficient documentation

## 2022-09-17 DIAGNOSIS — Z8639 Personal history of other endocrine, nutritional and metabolic disease: Secondary | ICD-10-CM

## 2022-09-17 DIAGNOSIS — Z131 Encounter for screening for diabetes mellitus: Secondary | ICD-10-CM

## 2022-09-17 DIAGNOSIS — G4709 Other insomnia: Secondary | ICD-10-CM

## 2022-09-17 DIAGNOSIS — I10 Essential (primary) hypertension: Secondary | ICD-10-CM

## 2022-09-17 DIAGNOSIS — E7849 Other hyperlipidemia: Secondary | ICD-10-CM

## 2022-09-17 DIAGNOSIS — Z1231 Encounter for screening mammogram for malignant neoplasm of breast: Secondary | ICD-10-CM

## 2022-09-17 DIAGNOSIS — Z1159 Encounter for screening for other viral diseases: Secondary | ICD-10-CM

## 2022-09-17 DIAGNOSIS — D508 Other iron deficiency anemias: Secondary | ICD-10-CM

## 2022-09-17 MED ORDER — HYDROXYZINE PAMOATE 25 MG PO CAPS: 25.0000 mg | ORAL_CAPSULE | Freq: Every evening | ORAL | 0 refills | Status: DC | PRN

## 2022-09-17 NOTE — Addendum Note (Signed)
Addended by: Bo Merino on: 09/17/2022 09:22 AM   Modules accepted: Orders

## 2022-09-18 LAB — CBC WITH DIFFERENTIAL/PLATELET
Absolute Monocytes: 498 cells/uL (ref 200–950)
Basophils Absolute: 57 cells/uL (ref 0–200)
Basophils Relative: 0.9 %
Eosinophils Absolute: 158 cells/uL (ref 15–500)
Eosinophils Relative: 2.5 %
HCT: 38.3 % (ref 35.0–45.0)
Hemoglobin: 12.7 g/dL (ref 11.7–15.5)
Lymphs Abs: 1361 cells/uL (ref 850–3900)
MCH: 30 pg (ref 27.0–33.0)
MCHC: 33.2 g/dL (ref 32.0–36.0)
MCV: 90.5 fL (ref 80.0–100.0)
MPV: 10.5 fL (ref 7.5–12.5)
Monocytes Relative: 7.9 %
Neutro Abs: 4227 cells/uL (ref 1500–7800)
Neutrophils Relative %: 67.1 %
Platelets: 315 10*3/uL (ref 140–400)
RBC: 4.23 10*6/uL (ref 3.80–5.10)
RDW: 12.7 % (ref 11.0–15.0)
Total Lymphocyte: 21.6 %
WBC: 6.3 10*3/uL (ref 3.8–10.8)

## 2022-09-18 LAB — COMPLETE METABOLIC PANEL WITH GFR
AG Ratio: 1.1 (calc) (ref 1.0–2.5)
ALT: 10 U/L (ref 6–29)
AST: 14 U/L (ref 10–35)
Albumin: 4.2 g/dL (ref 3.6–5.1)
Alkaline phosphatase (APISO): 76 U/L (ref 37–153)
BUN: 12 mg/dL (ref 7–25)
CO2: 24 mmol/L (ref 20–32)
Calcium: 10.1 mg/dL (ref 8.6–10.4)
Chloride: 107 mmol/L (ref 98–110)
Creat: 0.76 mg/dL (ref 0.50–1.05)
Globulin: 3.7 g/dL (calc) (ref 1.9–3.7)
Glucose, Bld: 91 mg/dL (ref 65–99)
Potassium: 4 mmol/L (ref 3.5–5.3)
Sodium: 141 mmol/L (ref 135–146)
Total Bilirubin: 0.6 mg/dL (ref 0.2–1.2)
Total Protein: 7.9 g/dL (ref 6.1–8.1)
eGFR: 87 mL/min/{1.73_m2} (ref >=60)

## 2022-09-18 LAB — LIPID PANEL
Cholesterol: 195 mg/dL (ref <200)
HDL: 70 mg/dL (ref >=50)
LDL Cholesterol (Calc): 108 mg/dL (calc) — ABNORMAL HIGH
Non-HDL Cholesterol (Calc): 125 mg/dL (calc) (ref <130)
Total CHOL/HDL Ratio: 2.8 (calc) (ref <5.0)
Triglycerides: 77 mg/dL (ref <150)

## 2022-09-18 LAB — HEMOGLOBIN A1C
Hgb A1c MFr Bld: 5.4 % of total Hgb (ref <5.7)
Mean Plasma Glucose: 108 mg/dL
eAG (mmol/L): 6 mmol/L

## 2022-09-18 LAB — HEPATITIS C ANTIBODY: Hepatitis C Ab: NONREACTIVE

## 2022-09-18 LAB — HIV ANTIBODY (ROUTINE TESTING W REFLEX): HIV 1&2 Ab, 4th Generation: NONREACTIVE

## 2022-09-20 LAB — CYTOLOGY - PAP
Chlamydia: NEGATIVE
Comment: NEGATIVE
Comment: NORMAL
Diagnosis: NEGATIVE
Neisseria Gonorrhea: NEGATIVE

## 2022-11-04 ENCOUNTER — Other Ambulatory Visit: Payer: Self-pay | Admitting: Nurse Practitioner

## 2022-11-04 DIAGNOSIS — I10 Essential (primary) hypertension: Secondary | ICD-10-CM

## 2022-11-04 DIAGNOSIS — G4709 Other insomnia: Secondary | ICD-10-CM

## 2022-11-05 NOTE — Telephone Encounter (Signed)
Requested Prescriptions  Pending Prescriptions Disp Refills   bisoprolol-hydrochlorothiazide (ZIAC) 5-6.25 MG tablet [Pharmacy Med Name: BISOPROLOL/HCTZ 5MG /6.25MG  TABS] 90 tablet 1    Sig: TAKE 1 TABLET BY MOUTH DAILY     Cardiovascular: Beta Blocker + Diuretic Combos Passed - 11/04/2022  6:23 AM      Passed - K in normal range and within 180 days    Potassium  Date Value Ref Range Status  09/17/2022 4.0 3.5 - 5.3 mmol/L Final         Passed - Na in normal range and within 180 days    Sodium  Date Value Ref Range Status  09/17/2022 141 135 - 146 mmol/L Final  03/14/2022 140 134 - 144 mmol/L Final         Passed - Cr in normal range and within 180 days    Creat  Date Value Ref Range Status  09/17/2022 0.76 0.50 - 1.05 mg/dL Final         Passed - eGFR in normal range and within 180 days    eGFR  Date Value Ref Range Status  09/17/2022 87 > OR = 60 mL/min/1.57m2 Final  03/14/2022 78 >59 mL/min/1.73 Final         Passed - Last BP in normal range    BP Readings from Last 1 Encounters:  09/17/22 132/84         Passed - Last Heart Rate in normal range    Pulse Readings from Last 1 Encounters:  09/17/22 61         Passed - Valid encounter within last 6 months    Recent Outpatient Visits           1 month ago Annual physical exam   [facility] Bo Merino, FNP   7 months ago Hypertension, unspecified type   [facility] Bo Merino, FNP       Future Appointments             In 4 months Reece Packer, Myna Hidalgo, [facility], [place]   In 10 months Reece Packer, Myna Hidalgo, [facility], [facility]             hydrOXYzine (VISTARIL) 25 MG capsule [Pharmacy Med Name: HYDROXYZINE PAMOATE 25MG  CAPSULES] 90 capsule 3    Sig: TAKE 1 CAPSULE(25 MG) BY MOUTH AT BEDTIME AS NEEDED     Ear, Nose, and Throat:  Antihistamines 2 Passed - 11/04/2022  6:23 AM      Passed - Cr in  normal range and within 360 days    Creat  Date Value Ref Range Status  09/17/2022 0.76 0.50 - 1.05 mg/dL Final         Passed - Valid encounter within last 12 months    Recent Outpatient Visits           1 month ago Annual physical exam   [place], FNP   7 months ago Hypertension, unspecified type   [facility] Bo Merino, FNP       Future Appointments             In 4 months Reece Packer, Myna Hidalgo, [facility], [place]   In 10 months Reece Packer, Myna Hidalgo, [facility], [facility]

## 2022-12-21 ENCOUNTER — Encounter: Payer: Self-pay | Admitting: Nurse Practitioner

## 2023-03-18 ENCOUNTER — Encounter: Payer: Self-pay | Admitting: Nurse Practitioner

## 2023-03-18 ENCOUNTER — Other Ambulatory Visit: Payer: Self-pay

## 2023-03-18 ENCOUNTER — Ambulatory Visit: Payer: 59 | Admitting: Nurse Practitioner

## 2023-03-18 VITALS — BP 132/76 | HR 74 | Temp 97.9°F | Resp 16 | Ht 62.0 in | Wt 295.2 lb

## 2023-03-18 DIAGNOSIS — E7849 Other hyperlipidemia: Secondary | ICD-10-CM | POA: Diagnosis not present

## 2023-03-18 DIAGNOSIS — Z6841 Body Mass Index (BMI) 40.0 and over, adult: Secondary | ICD-10-CM

## 2023-03-18 DIAGNOSIS — G4709 Other insomnia: Secondary | ICD-10-CM

## 2023-03-18 DIAGNOSIS — E559 Vitamin D deficiency, unspecified: Secondary | ICD-10-CM

## 2023-03-18 DIAGNOSIS — M7989 Other specified soft tissue disorders: Secondary | ICD-10-CM

## 2023-03-18 DIAGNOSIS — I10 Essential (primary) hypertension: Secondary | ICD-10-CM

## 2023-03-18 MED ORDER — BISOPROLOL-HYDROCHLOROTHIAZIDE 5-6.25 MG PO TABS: 1.0000 | ORAL_TABLET | Freq: Every day | ORAL | 1 refills | Status: DC

## 2023-03-18 NOTE — Assessment & Plan Note (Signed)
Continue working on lifestyle modification.  

## 2023-03-18 NOTE — Assessment & Plan Note (Signed)
Call and schedule appointment with vascular for follow up.

## 2023-03-18 NOTE — Assessment & Plan Note (Signed)
Doing well, not currently on supplement.  Last vitamin d in normal range.

## 2023-03-18 NOTE — Assessment & Plan Note (Signed)
Continue to work on lifestyle modification.  Eat well balanced diet with portion control.  Increase physical activity as tolerated.

## 2023-03-18 NOTE — Progress Notes (Signed)
BP 132/76   Pulse 74   Temp 97.9 F (36.6 C) (Oral)   Resp 16   Ht 5\' 2"  (1.575 m)   Wt 295 lb 3.2 oz (133.9 kg)   SpO2 97%   BMI 53.99 kg/m    Subjective:    Patient ID: Jennifer Mcbride, female    DOB: 04-08-1956, 67 y.o.   MRN: [id number]  HPI: Jennifer Mcbride is a 67 y.o. female  Chief Complaint  Patient presents with   Hypertension    6 month follow up   Hypertension:  -Medications: bisoprolol-hydrochlorothiazide 5-6.25 mg daily.   -Patient is compliant with above medications and reports no side effects. -Checking BP at home (average): 120s -Denies any SOB, CP, vision changes, LE edema or symptoms of hypotension -Diet: she tries to watch her sodium in her diet -Exercise: walks everywhere     03/18/2023    7:55 AM 09/17/2022    8:20 AM 03/14/2022    8:18 AM  Vitals with BMI  Height 5\' 2"  5\' 2"  5\' 1"   Weight 295 lbs 3 oz 290 lbs 11 oz 294 lbs  BMI 53.98 53.16 55.58  Systolic 132 132 440  Diastolic 76 84 70  Pulse 74 61 79     Vitamin D deficiency:last vitamin d level was in normal range.  Patient is no longer taking supplementation.   HLD:  -Medications: not currently on medications -Last lipid panel:  Lipid Panel     Component Value Date/Time   CHOL 195 09/17/2022 0924   CHOL 184 03/14/2022 0913   TRIG 77 09/17/2022 0924   HDL 70 09/17/2022 0924   HDL 71 03/14/2022 0913   CHOLHDL 2.8 09/17/2022 0924   LDLCALC 108 (H) 09/17/2022 0924   LABVLDL 11 03/14/2022 0913      The 10-year ASCVD risk score (Arnett DK, et al., 2019) is: 9.1%   Values used to calculate the score:     Age: 50 years     Sex: Female     Is Non-Hispanic African American: Yes     Diabetic: No     Tobacco smoker: No     Systolic Blood Pressure: 132 mmHg     Is BP treated: Yes     HDL Cholesterol: 70 mg/dL     Total Cholesterol: 195 mg/dL   Obesity:  Current weight : 295 lbs BMI: 53.99 previous weight:290 lbs Treatment Tried: lifestyle modification Comorbidities:  HTN, HLD    Right leg swelling: Patient recently saw vascular surgery on 2/20/ 2023.  It was recommended that she use compression socks.   According to vascular note she does have evidence of reflux in her right lower extremity they gave her medical grade compression stocking prescription.  She was advised to wear these daily.  And to elevate her lower extremities whenever possible.  Patient is to follow-up with them in 3 months for reevaluation of noninvasive studies. She has not followed up with them.  They also suspected the patient may have more of a lymphedema component going on as well.she is going to call and schedule an appointment with vascular for recheck.    Insomnia: she takes hydroxyzine 25 mg as needed for sleep.   Relevant past medical, surgical, family and social history reviewed and updated as indicated. Interim medical history since our last visit reviewed. Allergies and medications reviewed and updated.  Review of Systems  Constitutional: Negative for fever or weight change.  Respiratory: Negative for cough and shortness of breath.  Cardiovascular: Negative for chest pain or palpitations.  Gastrointestinal: Negative for abdominal pain, no bowel changes.  Musculoskeletal: Negative for gait problem or joint swelling.  Skin: Negative for rash.  Neurological: Negative for dizziness or headache.  No other specific complaints in a complete review of systems (except as listed in HPI above).      Objective:    BP 132/76   Pulse 74   Temp 97.9 F (36.6 C) (Oral)   Resp 16   Ht 5\' 2"  (1.575 m)   Wt 295 lb 3.2 oz (133.9 kg)   SpO2 97%   BMI 53.99 kg/m   Wt Readings from Last 3 Encounters:  03/18/23 295 lb 3.2 oz (133.9 kg)  09/17/22 290 lb 11.2 oz (131.9 kg)  03/14/22 294 lb (133.4 kg)    Physical Exam  Constitutional: Patient appears well-developed and well-nourished. Obese  No distress.  HEENT: head atraumatic, normocephalic, pupils equal and reactive to light,   neck supple Cardiovascular: Normal rate, regular rhythm and normal heart sounds.  No murmur heard. No BLE edema. Pulmonary/Chest: Effort normal and breath sounds normal. No respiratory distress. Abdominal: Soft.  There is no tenderness. Lower extremities: 3+ edema right lower leg 1+ edema left lower leg Psychiatric: Patient has a normal mood and affect. behavior is normal. Judgment and thought content normal.  Results for orders placed or performed in visit on 09/17/22  CBC with Differential/Platelet  Result Value Ref Range   WBC 6.3 3.8 - 10.8 Thousand/uL   RBC 4.23 3.80 - 5.10 Million/uL   Hemoglobin 12.7 11.7 - 15.5 g/dL   HCT 29.5 62.1 - 30.8 %   MCV 90.5 80.0 - 100.0 fL   MCH 30.0 27.0 - 33.0 pg   MCHC 33.2 32.0 - 36.0 g/dL   RDW 65.7 84.6 - 96.2 %   Platelets 315 140 - 400 Thousand/uL   MPV 10.5 7.5 - 12.5 fL   Neutro Abs 4,227 1,500 - 7,800 cells/uL   Lymphs Abs 1,361 850 - 3,900 cells/uL   Absolute Monocytes 498 200 - 950 cells/uL   Eosinophils Absolute 158 15 - 500 cells/uL   Basophils Absolute 57 0 - 200 cells/uL   Neutrophils Relative % 67.1 %   Total Lymphocyte 21.6 %   Monocytes Relative 7.9 %   Eosinophils Relative 2.5 %   Basophils Relative 0.9 %  COMPLETE METABOLIC PANEL WITH GFR  Result Value Ref Range   Glucose, Bld 91 65 - 99 mg/dL   BUN 12 7 - 25 mg/dL   Creat 9.52 8.41 - 3.24 mg/dL   eGFR 87 > OR = 60 MW/NUU/7.25D6   BUN/Creatinine Ratio SEE NOTE: 6 - 22 (calc)   Sodium 141 135 - 146 mmol/L   Potassium 4.0 3.5 - 5.3 mmol/L   Chloride 107 98 - 110 mmol/L   CO2 24 20 - 32 mmol/L   Calcium 10.1 8.6 - 10.4 mg/dL   Total Protein 7.9 6.1 - 8.1 g/dL   Albumin 4.2 3.6 - 5.1 g/dL   Globulin 3.7 1.9 - 3.7 g/dL (calc)   AG Ratio 1.1 1.0 - 2.5 (calc)   Total Bilirubin 0.6 0.2 - 1.2 mg/dL   Alkaline phosphatase (APISO) 76 37 - 153 U/L   AST 14 10 - 35 U/L   ALT 10 6 - 29 U/L  Lipid panel  Result Value Ref Range   Cholesterol 195 <200 mg/dL   HDL 70 > OR =  50 mg/dL   Triglycerides 77 <644 mg/dL  LDL Cholesterol (Calc) 108 (H) mg/dL (calc)   Total CHOL/HDL Ratio 2.8 <5.0 (calc)   Non-HDL Cholesterol (Calc) 125 <130 mg/dL (calc)  Hemoglobin Z6X  Result Value Ref Range   Hgb A1c MFr Bld 5.4 <5.7 % of total Hgb   Mean Plasma Glucose 108 mg/dL   eAG (mmol/L) 6.0 mmol/L  Hepatitis C antibody  Result Value Ref Range   Hepatitis C Ab NON-REACTIVE NON-REACTIVE  HIV Antibody (routine testing w rflx)  Result Value Ref Range   HIV 1&2 Ab, 4th Generation NON-REACTIVE NON-REACTIVE  Cytology - PAP  Result Value Ref Range   Neisseria Gonorrhea Negative    Chlamydia Negative    Adequacy      Satisfactory for evaluation; transformation zone component PRESENT.   Diagnosis      - Negative for intraepithelial lesion or malignancy (NILM)   Comment Normal Reference Ranger Chlamydia - Negative    Comment      Normal Reference Range Neisseria Gonorrhea - Negative      Assessment & Plan:   Problem List Items Addressed This Visit       Cardiovascular and Mediastinum   Hypertension - Primary    Continue bisoprolol-hydrochlorothiazide 5-6.25 mg daily.      Relevant Medications   bisoprolol-hydrochlorothiazide (ZIAC) 5-6.25 MG tablet     Other   Vitamin D deficiency    Doing well, not currently on supplement.  Last vitamin d in normal range.       Morbid obesity with BMI of 50.0-59.9, adult ([facility])    Continue to work on lifestyle modification.  Eat well balanced diet with portion control.  Increase physical activity as tolerated.       Right leg swelling    Call and schedule appointment with vascular for follow up.       Other hyperlipidemia    Continue working on lifestyle modification.       Relevant Medications   bisoprolol-hydrochlorothiazide (ZIAC) 5-6.25 MG tablet   Other insomnia    Continue hydroxyzine as needed         Follow up plan: Return in about 6 months (around 09/18/2023) for follow up.

## 2023-03-18 NOTE — Assessment & Plan Note (Signed)
Continue bisoprolol-hydrochlorothiazide 5-6.25 mg daily.

## 2023-03-18 NOTE — Assessment & Plan Note (Signed)
Continue hydroxyzine as needed 

## 2023-04-16 ENCOUNTER — Ambulatory Visit (INDEPENDENT_AMBULATORY_CARE_PROVIDER_SITE_OTHER): Payer: 59 | Admitting: Nurse Practitioner

## 2023-04-16 ENCOUNTER — Encounter (INDEPENDENT_AMBULATORY_CARE_PROVIDER_SITE_OTHER): Payer: Self-pay

## 2023-04-16 DIAGNOSIS — M7989 Other specified soft tissue disorders: Secondary | ICD-10-CM

## 2023-05-09 ENCOUNTER — Ambulatory Visit
Admission: RE | Admit: 2023-05-09 | Discharge: 2023-05-09 | Disposition: A | Discharge status: Home / Self Care | Payer: 59 | Source: Ambulatory Visit | Attending: Nurse Practitioner | Admitting: Nurse Practitioner

## 2023-05-09 DIAGNOSIS — Z1231 Encounter for screening mammogram for malignant neoplasm of breast: Secondary | ICD-10-CM | POA: Diagnosis present

## 2023-05-09 DIAGNOSIS — Z0000 Encounter for general adult medical examination without abnormal findings: Secondary | ICD-10-CM | POA: Insufficient documentation

## 2023-07-25 NOTE — Progress Notes (Deleted)
There were no vitals taken for this visit.   Subjective:    Patient ID: Jennifer Mcbride, female    DOB: March 23, 1956, 67 y.o.   MRN: [id number]  HPI: Jennifer Mcbride is a 67 y.o. female  No chief complaint on file.   Discussed the use of AI scribe software for clinical note transcription with the patient, who gave verbal consent to proceed.  History of Present Illness           03/18/2023    7:57 AM 09/17/2022    8:38 AM 03/14/2022    8:14 AM  Depression screen PHQ 2/9  Decreased Interest 0 0 0  Down, Depressed, Hopeless 0 1 0  PHQ - 2 Score 0 1 0  Altered sleeping   0  Tired, decreased energy   0  Change in appetite   0  Feeling bad or failure about yourself    0  Trouble concentrating   0  Moving slowly or fidgety/restless   0  Suicidal thoughts   0  PHQ-9 Score   0  Difficult doing work/chores   Not difficult at all    Relevant past medical, surgical, family and social history reviewed and updated as indicated. Interim medical history since our last visit reviewed. Allergies and medications reviewed and updated.  Review of Systems  Per HPI unless specifically indicated above     Objective:    There were no vitals taken for this visit.  {Vitals History (Optional):23777} Wt Readings from Last 3 Encounters:  03/18/23 295 lb 3.2 oz (133.9 kg)  09/17/22 290 lb 11.2 oz (131.9 kg)  03/14/22 294 lb (133.4 kg)    Physical Exam  Results for orders placed or performed in visit on 09/17/22  Cytology - PAP   Collection Time: 09/17/22  9:03 AM  Result Value Ref Range   Neisseria Gonorrhea Negative    Chlamydia Negative    Adequacy      Satisfactory for evaluation; transformation zone component PRESENT.   Diagnosis      - Negative for intraepithelial lesion or malignancy (NILM)   Comment Normal Reference Ranger Chlamydia - Negative    Comment      Normal Reference Range Neisseria Gonorrhea - Negative  CBC with Differential/Platelet   Collection Time:  09/17/22  9:24 AM  Result Value Ref Range   WBC 6.3 3.8 - 10.8 Thousand/uL   RBC 4.23 3.80 - 5.10 Million/uL   Hemoglobin 12.7 11.7 - 15.5 g/dL   HCT 17.6 16.0 - 73.7 %   MCV 90.5 80.0 - 100.0 fL   MCH 30.0 27.0 - 33.0 pg   MCHC 33.2 32.0 - 36.0 g/dL   RDW 10.6 26.9 - 48.5 %   Platelets 315 140 - 400 Thousand/uL   MPV 10.5 7.5 - 12.5 fL   Neutro Abs 4,227 1,500 - 7,800 cells/uL   Lymphs Abs 1,361 850 - 3,900 cells/uL   Absolute Monocytes 498 200 - 950 cells/uL   Eosinophils Absolute 158 15 - 500 cells/uL   Basophils Absolute 57 0 - 200 cells/uL   Neutrophils Relative % 67.1 %   Total Lymphocyte 21.6 %   Monocytes Relative 7.9 %   Eosinophils Relative 2.5 %   Basophils Relative 0.9 %  COMPLETE METABOLIC PANEL WITH GFR   Collection Time: 09/17/22  9:24 AM  Result Value Ref Range   Glucose, Bld 91 65 - 99 mg/dL   BUN 12 7 - 25 mg/dL   Creat 4.62 7.03 -  1.05 mg/dL   eGFR 87 > OR = 60 HK/VQQ/5.95G3   BUN/Creatinine Ratio SEE NOTE: 6 - 22 (calc)   Sodium 141 135 - 146 mmol/L   Potassium 4.0 3.5 - 5.3 mmol/L   Chloride 107 98 - 110 mmol/L   CO2 24 20 - 32 mmol/L   Calcium 10.1 8.6 - 10.4 mg/dL   Total Protein 7.9 6.1 - 8.1 g/dL   Albumin 4.2 3.6 - 5.1 g/dL   Globulin 3.7 1.9 - 3.7 g/dL (calc)   AG Ratio 1.1 1.0 - 2.5 (calc)   Total Bilirubin 0.6 0.2 - 1.2 mg/dL   Alkaline phosphatase (APISO) 76 37 - 153 U/L   AST 14 10 - 35 U/L   ALT 10 6 - 29 U/L  Lipid panel   Collection Time: 09/17/22  9:24 AM  Result Value Ref Range   Cholesterol 195 <200 mg/dL   HDL 70 > OR = 50 mg/dL   Triglycerides 77 <875 mg/dL   LDL Cholesterol (Calc) 108 (H) mg/dL (calc)   Total CHOL/HDL Ratio 2.8 <5.0 (calc)   Non-HDL Cholesterol (Calc) 125 <130 mg/dL (calc)  Hemoglobin I4P   Collection Time: 09/17/22  9:24 AM  Result Value Ref Range   Hgb A1c MFr Bld 5.4 <5.7 % of total Hgb   Mean Plasma Glucose 108 mg/dL   eAG (mmol/L) 6.0 mmol/L  Hepatitis C antibody   Collection Time: 09/17/22  9:24  AM  Result Value Ref Range   Hepatitis C Ab NON-REACTIVE NON-REACTIVE  HIV Antibody (routine testing w rflx)   Collection Time: 09/17/22  9:24 AM  Result Value Ref Range   HIV 1&2 Ab, 4th Generation NON-REACTIVE NON-REACTIVE   {Labs (Optional):23779}    Assessment & Plan:   Problem List Items Addressed This Visit   None    Assessment and Plan             Follow up plan: No follow-ups on file.

## 2023-07-26 ENCOUNTER — Ambulatory Visit: Payer: 59 | Admitting: Nurse Practitioner

## 2023-07-29 ENCOUNTER — Ambulatory Visit: Payer: 59 | Admitting: Nurse Practitioner

## 2023-07-29 NOTE — Progress Notes (Deleted)
 There were no vitals taken for this visit.   Subjective:    Patient ID: Jennifer Mcbride, female    DOB: March 23, 1956, 67 y.o.   MRN: [id number]  HPI: Jennifer Mcbride is a 67 y.o. female  No chief complaint on file.   Discussed the use of AI scribe software for clinical note transcription with the patient, who gave verbal consent to proceed.  History of Present Illness           03/18/2023    7:57 AM 09/17/2022    8:38 AM 03/14/2022    8:14 AM  Depression screen PHQ 2/9  Decreased Interest 0 0 0  Down, Depressed, Hopeless 0 1 0  PHQ - 2 Score 0 1 0  Altered sleeping   0  Tired, decreased energy   0  Change in appetite   0  Feeling bad or failure about yourself    0  Trouble concentrating   0  Moving slowly or fidgety/restless   0  Suicidal thoughts   0  PHQ-9 Score   0  Difficult doing work/chores   Not difficult at all    Relevant past medical, surgical, family and social history reviewed and updated as indicated. Interim medical history since our last visit reviewed. Allergies and medications reviewed and updated.  Review of Systems  Per HPI unless specifically indicated above     Objective:    There were no vitals taken for this visit.  {Vitals History (Optional):23777} Wt Readings from Last 3 Encounters:  03/18/23 295 lb 3.2 oz (133.9 kg)  09/17/22 290 lb 11.2 oz (131.9 kg)  03/14/22 294 lb (133.4 kg)    Physical Exam  Results for orders placed or performed in visit on 09/17/22  Cytology - PAP   Collection Time: 09/17/22  9:03 AM  Result Value Ref Range   Neisseria Gonorrhea Negative    Chlamydia Negative    Adequacy      Satisfactory for evaluation; transformation zone component PRESENT.   Diagnosis      - Negative for intraepithelial lesion or malignancy (NILM)   Comment Normal Reference Ranger Chlamydia - Negative    Comment      Normal Reference Range Neisseria Gonorrhea - Negative  CBC with Differential/Platelet   Collection Time:  09/17/22  9:24 AM  Result Value Ref Range   WBC 6.3 3.8 - 10.8 Thousand/uL   RBC 4.23 3.80 - 5.10 Million/uL   Hemoglobin 12.7 11.7 - 15.5 g/dL   HCT 17.6 16.0 - 73.7 %   MCV 90.5 80.0 - 100.0 fL   MCH 30.0 27.0 - 33.0 pg   MCHC 33.2 32.0 - 36.0 g/dL   RDW 10.6 26.9 - 48.5 %   Platelets 315 140 - 400 Thousand/uL   MPV 10.5 7.5 - 12.5 fL   Neutro Abs 4,227 1,500 - 7,800 cells/uL   Lymphs Abs 1,361 850 - 3,900 cells/uL   Absolute Monocytes 498 200 - 950 cells/uL   Eosinophils Absolute 158 15 - 500 cells/uL   Basophils Absolute 57 0 - 200 cells/uL   Neutrophils Relative % 67.1 %   Total Lymphocyte 21.6 %   Monocytes Relative 7.9 %   Eosinophils Relative 2.5 %   Basophils Relative 0.9 %  COMPLETE METABOLIC PANEL WITH GFR   Collection Time: 09/17/22  9:24 AM  Result Value Ref Range   Glucose, Bld 91 65 - 99 mg/dL   BUN 12 7 - 25 mg/dL   Creat 4.62 7.03 -  1.05 mg/dL   eGFR 87 > OR = 60 HK/VQQ/5.95G3   BUN/Creatinine Ratio SEE NOTE: 6 - 22 (calc)   Sodium 141 135 - 146 mmol/L   Potassium 4.0 3.5 - 5.3 mmol/L   Chloride 107 98 - 110 mmol/L   CO2 24 20 - 32 mmol/L   Calcium 10.1 8.6 - 10.4 mg/dL   Total Protein 7.9 6.1 - 8.1 g/dL   Albumin 4.2 3.6 - 5.1 g/dL   Globulin 3.7 1.9 - 3.7 g/dL (calc)   AG Ratio 1.1 1.0 - 2.5 (calc)   Total Bilirubin 0.6 0.2 - 1.2 mg/dL   Alkaline phosphatase (APISO) 76 37 - 153 U/L   AST 14 10 - 35 U/L   ALT 10 6 - 29 U/L  Lipid panel   Collection Time: 09/17/22  9:24 AM  Result Value Ref Range   Cholesterol 195 <200 mg/dL   HDL 70 > OR = 50 mg/dL   Triglycerides 77 <875 mg/dL   LDL Cholesterol (Calc) 108 (H) mg/dL (calc)   Total CHOL/HDL Ratio 2.8 <5.0 (calc)   Non-HDL Cholesterol (Calc) 125 <130 mg/dL (calc)  Hemoglobin I4P   Collection Time: 09/17/22  9:24 AM  Result Value Ref Range   Hgb A1c MFr Bld 5.4 <5.7 % of total Hgb   Mean Plasma Glucose 108 mg/dL   eAG (mmol/L) 6.0 mmol/L  Hepatitis C antibody   Collection Time: 09/17/22  9:24  AM  Result Value Ref Range   Hepatitis C Ab NON-REACTIVE NON-REACTIVE  HIV Antibody (routine testing w rflx)   Collection Time: 09/17/22  9:24 AM  Result Value Ref Range   HIV 1&2 Ab, 4th Generation NON-REACTIVE NON-REACTIVE   {Labs (Optional):23779}    Assessment & Plan:   Problem List Items Addressed This Visit   None    Assessment and Plan             Follow up plan: No follow-ups on file.

## 2023-08-01 ENCOUNTER — Ambulatory Visit: Payer: 59 | Admitting: Nurse Practitioner

## 2023-08-08 ENCOUNTER — Encounter: Payer: Self-pay | Admitting: Nurse Practitioner

## 2023-08-08 DIAGNOSIS — I10 Essential (primary) hypertension: Secondary | ICD-10-CM

## 2023-08-12 MED ORDER — BISOPROLOL-HYDROCHLOROTHIAZIDE 5-6.25 MG PO TABS: 1.0000 | ORAL_TABLET | Freq: Every day | ORAL | 0 refills | Status: DC

## 2023-08-26 ENCOUNTER — Ambulatory Visit: Payer: 59 | Admitting: Nurse Practitioner

## 2023-08-26 ENCOUNTER — Other Ambulatory Visit: Payer: Self-pay

## 2023-08-26 ENCOUNTER — Other Ambulatory Visit: Payer: Self-pay | Admitting: Nurse Practitioner

## 2023-08-26 ENCOUNTER — Encounter: Payer: Self-pay | Admitting: Nurse Practitioner

## 2023-08-26 ENCOUNTER — Ambulatory Visit: Payer: 59 | Attending: Nurse Practitioner

## 2023-08-26 VITALS — BP 134/82 | HR 59 | Temp 97.7°F | Resp 16 | Ht 62.0 in | Wt 292.6 lb

## 2023-08-26 DIAGNOSIS — R55 Syncope and collapse: Secondary | ICD-10-CM

## 2023-08-26 DIAGNOSIS — I10 Essential (primary) hypertension: Secondary | ICD-10-CM

## 2023-08-26 DIAGNOSIS — R001 Bradycardia, unspecified: Secondary | ICD-10-CM | POA: Diagnosis not present

## 2023-08-26 MED ORDER — HYDROCHLOROTHIAZIDE 12.5 MG PO TABS: 12.5000 mg | ORAL_TABLET | Freq: Every day | ORAL | 0 refills | Status: DC

## 2023-08-26 NOTE — Telephone Encounter (Signed)
Refilled 08/26/23. Requested Prescriptions  Refused Prescriptions Disp Refills   hydrochlorothiazide (HYDRODIURIL) 12.5 MG tablet [Pharmacy Med Name: HYDROCHLOROTHIAZIDE 12.5MG  TABLETS] 90 tablet     Sig: TAKE 1 TABLET(12.5 MG) BY MOUTH DAILY     Cardiovascular: Diuretics - Thiazide Failed - 08/26/2023  3:54 PM      Failed - Cr in normal range and within 180 days    Creat  Date Value Ref Range Status  09/17/2022 0.76 0.50 - 1.05 mg/dL Final         Failed - K in normal range and within 180 days    Potassium  Date Value Ref Range Status  09/17/2022 4.0 3.5 - 5.3 mmol/L Final         Failed - Na in normal range and within 180 days    Sodium  Date Value Ref Range Status  09/17/2022 141 135 - 146 mmol/L Final  03/14/2022 140 134 - 144 mmol/L Final         Passed - Last BP in normal range    BP Readings from Last 1 Encounters:  08/26/23 134/82         Passed - Valid encounter within last 6 months    Recent Outpatient Visits           Today Syncope, unspecified syncope type   [facility] Berniece Salines, FNP   5 months ago Hypertension, unspecified type   [facility] Berniece Salines, FNP   11 months ago Annual physical exam   [facility] Berniece Salines, FNP   1 year ago Hypertension, unspecified type   [facility] Berniece Salines, FNP       Future Appointments             In 4 weeks Zane Herald, Rudolpho Sevin, FNP [facility], [facility]

## 2023-08-26 NOTE — Progress Notes (Signed)
BP 134/82 (Cuff Size: Large)   Pulse (!) 59 Comment: after walking  Temp 97.7 F (36.5 C) (Oral)   Resp 16   Ht 5\' 2"  (1.575 m)   Wt 292 lb 9.6 oz (132.7 kg)   SpO2 97%   BMI 53.52 kg/m    Subjective:    Patient ID: Jennifer Mcbride, female    DOB: 04-18-1956, 68 y.o.   MRN: [id number]  HPI: Jennifer Mcbride is a 68 y.o. female  Chief Complaint  Patient presents with   Loss of Consciousness    Discussed the use of AI scribe software for clinical note transcription with the patient, who gave verbal consent to proceed.  History of Present Illness   The patient, with a history of htn, and hyperlipidemia, presents after a recent syncopal episode. She reports a stressful period leading up to the event, including a forced move from her apartment complex. The day of the event was 'like a regular day,' but she felt 'tired like I haven't felt for a long time.' She had her hair done, returned home, and upon exiting her car, she lost consciousness. She estimates she was unconscious for approximately five minutes. She denies any prodromal symptoms, including dizziness, chest pain, or shortness of breath. Upon regaining consciousness, she felt fine and completed an eight-hour work shift without issue. She denies any subsequent episodes.       08/26/2023    9:35 AM 03/18/2023    7:57 AM 09/17/2022    8:38 AM  Depression screen PHQ 2/9  Decreased Interest 0 0 0  Down, Depressed, Hopeless 0 0 1  PHQ - 2 Score 0 0 1    Relevant past medical, surgical, family and social history reviewed and updated as indicated. Interim medical history since our last visit reviewed. Allergies and medications reviewed and updated.  Review of Systems  Constitutional: Negative for fever or weight change.  Respiratory: Negative for cough and shortness of breath.   Cardiovascular: Negative for chest pain or palpitations.  Gastrointestinal: Negative for abdominal pain, no bowel changes.  Musculoskeletal:  Negative for gait problem or joint swelling.  Skin: Negative for rash.  Neurological: Negative for dizziness or headache.  No other specific complaints in a complete review of systems (except as listed in HPI above).      Objective:    BP 134/82 (Cuff Size: Large)   Pulse (!) 59 Comment: after walking  Temp 97.7 F (36.5 C) (Oral)   Resp 16   Ht 5\' 2"  (1.575 m)   Wt 292 lb 9.6 oz (132.7 kg)   SpO2 97%   BMI 53.52 kg/m   BP Readings from Last 3 Encounters:  08/26/23 134/82  03/18/23 132/76  09/17/22 132/84     Wt Readings from Last 3 Encounters:  08/26/23 292 lb 9.6 oz (132.7 kg)  03/18/23 295 lb 3.2 oz (133.9 kg)  09/17/22 290 lb 11.2 oz (131.9 kg)    Physical Exam  Constitutional: Patient appears well-developed and well-nourished. Obese  No distress.  HEENT: head atraumatic, normocephalic, pupils equal and reactive to light, ears TMs clear, neck supple, throat within normal limits Cardiovascular: Normal rate, regular rhythm and normal heart sounds.  No murmur heard. No BLE edema. Pulmonary/Chest: Effort normal and breath sounds normal. No respiratory distress. Abdominal: Soft.  There is no tenderness. Neuro:  equal grip, stable gait, equal sensation, no arm drift,  cranial nerves intact Psychiatric: Patient has a normal mood and affect. behavior is normal. Judgment  and thought content normal.       Assessment & Plan:   Problem List Items Addressed This Visit       Cardiovascular and Mediastinum   Hypertension   Relevant Medications   hydrochlorothiazide (HYDRODIURIL) 12.5 MG tablet   Other Visit Diagnoses       Syncope, unspecified syncope type    -  Primary   Relevant Orders   EKG 12-Lead   CBC with Differential/Platelet   COMPLETE METABOLIC PANEL WITH GFR   TSH   Hemoglobin A1c   Orthostatic vital signs   Magnesium   Troponin I   VITAMIN D 25 Hydroxy (Vit-D Deficiency, Fractures)   Iron, TIBC and Ferritin Panel   Vitamin B12   Ambulatory referral  to Cardiology   LONG TERM MONITOR (3-14 DAYS)     Bradycardia       Relevant Orders   Ambulatory referral to Cardiology   LONG TERM MONITOR (3-14 DAYS)        Assessment and Plan    Syncope Sudden loss of consciousness with spontaneous recovery, occurred on 08/23/2023. No preceding symptoms such as dizziness, chest pain, or shortness of breath. No post-ictal confusion, incontinence, or tongue biting. Recent stressors noted, including a move in December. -Perform EKG to assess cardiac function. -Order labs including complete blood count, metabolic panel, and cardiac enzymes. -Order head CT to rule out intracranial pathology. -Perform orthostatic vital signs to assess for orthostatic hypotension. -Consider referral to neurology   Psychosocial Stress Recent move and potential job dissatisfaction contributing to stress. Expressed interest in seeing a therapist. -Encourage patient to seek therapy for stress management.        EKG: sinus bradycardia. Rate 39 Rechecked hear rate and it was back up to 60.  Change of plan,  when obtained EKG, heart rate was bradycardic.   Will place stat referral to cardiology Discontinue bisoprolol-hydrochlorothiazide Start hydrochlorothiazide 12.5 mg daily Zio patch ordered If any worsening symptoms recommend patient go to er.   Follow up plan: Return if symptoms worsen or fail to improve.

## 2023-08-27 ENCOUNTER — Encounter: Payer: Self-pay | Admitting: Nurse Practitioner

## 2023-08-27 LAB — COMPLETE METABOLIC PANEL WITH GFR
AG Ratio: 1.4 (calc) (ref 1.0–2.5)
ALT: 11 U/L (ref 6–29)
AST: 15 U/L (ref 10–35)
Albumin: 4.2 g/dL (ref 3.6–5.1)
Alkaline phosphatase (APISO): 68 U/L (ref 37–153)
BUN: 8 mg/dL (ref 7–25)
CO2: 29 mmol/L (ref 20–32)
Calcium: 10.2 mg/dL (ref 8.6–10.4)
Chloride: 107 mmol/L (ref 98–110)
Creat: 0.83 mg/dL (ref 0.50–1.05)
Globulin: 3.1 g/dL (ref 1.9–3.7)
Glucose, Bld: 87 mg/dL (ref 65–99)
Potassium: 4 mmol/L (ref 3.5–5.3)
Sodium: 143 mmol/L (ref 135–146)
Total Bilirubin: 0.5 mg/dL (ref 0.2–1.2)
Total Protein: 7.3 g/dL (ref 6.1–8.1)
eGFR: 78 mL/min/{1.73_m2} (ref >=60)

## 2023-08-27 LAB — CBC WITH DIFFERENTIAL/PLATELET
Absolute Lymphocytes: 1487 {cells}/uL (ref 850–3900)
Absolute Monocytes: 513 {cells}/uL (ref 200–950)
Basophils Absolute: 59 {cells}/uL (ref 0–200)
Basophils Relative: 1 %
Eosinophils Absolute: 171 {cells}/uL (ref 15–500)
Eosinophils Relative: 2.9 %
HCT: 38.6 % (ref 35.0–45.0)
Hemoglobin: 12.4 g/dL (ref 11.7–15.5)
MCH: 29.5 pg (ref 27.0–33.0)
MCHC: 32.1 g/dL (ref 32.0–36.0)
MCV: 91.9 fL (ref 80.0–100.0)
MPV: 10.9 fL (ref 7.5–12.5)
Monocytes Relative: 8.7 %
Neutro Abs: 3670 {cells}/uL (ref 1500–7800)
Neutrophils Relative %: 62.2 %
Platelets: 294 10*3/uL (ref 140–400)
RBC: 4.2 10*6/uL (ref 3.80–5.10)
RDW: 12.8 % (ref 11.0–15.0)
Total Lymphocyte: 25.2 %
WBC: 5.9 10*3/uL (ref 3.8–10.8)

## 2023-08-27 LAB — MAGNESIUM: Magnesium: 2.3 mg/dL (ref 1.5–2.5)

## 2023-08-27 LAB — IRON,TIBC AND FERRITIN PANEL
%SAT: 25 % (ref 16–45)
Ferritin: 138 ng/mL (ref 16–288)
Iron: 83 ug/dL (ref 45–160)
TIBC: 327 ug/dL (ref 250–450)

## 2023-08-27 LAB — TROPONIN I: Troponin I: 6 ng/L (ref <=47)

## 2023-08-27 LAB — TSH: TSH: 1.72 m[IU]/L (ref 0.40–4.50)

## 2023-08-27 LAB — HEMOGLOBIN A1C
Hgb A1c MFr Bld: 5.4 %{Hb} (ref <5.7)
Mean Plasma Glucose: 108 mg/dL
eAG (mmol/L): 6 mmol/L

## 2023-08-27 LAB — VITAMIN D 25 HYDROXY (VIT D DEFICIENCY, FRACTURES): Vit D, 25-Hydroxy: 36 ng/mL (ref 30–100)

## 2023-08-27 LAB — VITAMIN B12: Vitamin B-12: 456 pg/mL (ref 200–1100)

## 2023-08-29 ENCOUNTER — Encounter: Payer: Self-pay | Admitting: Nurse Practitioner

## 2023-08-29 ENCOUNTER — Other Ambulatory Visit: Payer: Self-pay | Admitting: Nurse Practitioner

## 2023-09-18 ENCOUNTER — Ambulatory Visit: Payer: 59 | Admitting: Nurse Practitioner

## 2023-09-23 ENCOUNTER — Encounter: Payer: Self-pay | Admitting: Nurse Practitioner

## 2023-09-24 DIAGNOSIS — R55 Syncope and collapse: Secondary | ICD-10-CM

## 2023-09-24 DIAGNOSIS — R001 Bradycardia, unspecified: Secondary | ICD-10-CM | POA: Diagnosis not present

## 2023-09-25 ENCOUNTER — Encounter: Payer: Self-pay | Admitting: Nurse Practitioner

## 2023-09-26 ENCOUNTER — Encounter: Payer: 59 | Admitting: Nurse Practitioner

## 2023-09-30 NOTE — Progress Notes (Unsigned)
 Name: Jennifer Mcbride   MRN: [id number]    DOB: 1956/03/10   Date:10/01/2023       Progress Note  Subjective  Chief Complaint  Chief Complaint  Patient presents with   Annual Exam    HPI  Patient presents for annual CPE.  Diet: well balanced diet Exercise: not exercising,  recommend 150 min of physical activity weekly    Sleep: 4 hours, does take a nap during the day Last dental exam:5 years ago Last eye exam: last year Health Maintenance  Topic Date Due   DEXA scan (bone density measurement)  Never done   Zoster (Shingles) Vaccine (1 of 2) 12/29/2023*   DTaP/Tdap/Td vaccine (1 - Tdap) 09/30/2024*   Pneumonia Vaccine (1 of 1 - PCV) 09/30/2024*   Mammogram  05/08/2024   Colon Cancer Screening  03/28/2030   Flu Shot  Completed   COVID-19 Vaccine  Completed   Hepatitis C Screening  Completed   HPV Vaccine  Aged Out  *Topic was postponed. The date shown is not the original due date.    Flowsheet Row Office Visit from 10/01/2023 in [facility]  AUDIT-C Score 0      Depression: Phq 9 is  negative    10/01/2023    8:13 AM 08/26/2023    9:35 AM 03/18/2023    7:57 AM 09/17/2022    8:38 AM 03/14/2022    8:14 AM  Depression screen PHQ 2/9  Decreased Interest 0 0 0 0 0  Down, Depressed, Hopeless 0 0 0 1 0  PHQ - 2 Score 0 0 0 1 0  Altered sleeping 0    0  Tired, decreased energy 0    0  Change in appetite 0    0  Feeling bad or failure about yourself  0    0  Trouble concentrating 0    0  Moving slowly or fidgety/restless 0    0  Suicidal thoughts 0    0  PHQ-9 Score 0    0  Difficult doing work/chores Not difficult at all    Not difficult at all   Hypertension: BP Readings from Last 3 Encounters:  10/01/23 128/82  08/26/23 134/82  03/18/23 132/76   Obesity: Wt Readings from Last 3 Encounters:  10/01/23 287 lb 11.2 oz (130.5 kg)  08/26/23 292 lb 9.6 oz (132.7 kg)  03/18/23 295 lb 3.2 oz (133.9 kg)   BMI Readings from Last 3  Encounters:  10/01/23 52.62 kg/m  08/26/23 53.52 kg/m  03/18/23 53.99 kg/m     Vaccines:  HPV: up to at age 68 , ask insurance if age between 51-45  Shingrix: 26-64 yo and ask insurance if covered when patient above 29 yo Pneumonia:  educated and discussed with patient. Flu:  educated and discussed with patient.  Hep C Screening: completed STD testing and prevention (HIV/chl/gon/syphilis): completed Intimate partner violence:none Sexual History : not currently Menstrual History/LMP/Abnormal Bleeding: postmenopausal  Incontinence Symptoms: has incontinence, saw urology  Breast cancer:  - Last Mammogram: 05/09/2023 - BRCA gene screening: none  Osteoporosis: Discussed high calcium and vitamin D supplementation, weight bearing exercises, bone density ordered  Cervical cancer screening: 09/17/2022  Skin cancer: Discussed monitoring for atypical lesions  Colorectal cancer: 03/28/2020   Lung cancer:   Low Dose CT Chest recommended if Age 1-80 years, 20 pack-year currently smoking OR have quit w/in 15years. Patient does not qualify.   ECG: 08/26/2023  Advanced Care Planning: A voluntary discussion about  advance care planning including the explanation and discussion of advance directives.  Discussed health care proxy and Living will, and the patient was able to identify a health care proxy as son.  Patient does not have a living will at present time. If patient does have living will, I have requested they bring this to the clinic to be scanned in to their chart.  Lipids: Lab Results  Component Value Date   CHOL 195 09/17/2022   CHOL 184 03/14/2022   CHOL 197 01/17/2021   Lab Results  Component Value Date   HDL 70 09/17/2022   HDL 71 03/14/2022   HDL 69 01/17/2021   Lab Results  Component Value Date   LDLCALC 108 (H) 09/17/2022   LDLCALC 102 (H) 03/14/2022   LDLCALC 115 (H) 01/17/2021   Lab Results  Component Value Date   TRIG 77 09/17/2022   TRIG 58 03/14/2022   TRIG  72 01/17/2021   Lab Results  Component Value Date   CHOLHDL 2.8 09/17/2022   CHOLHDL 2.6 03/14/2022   CHOLHDL 2.9 01/17/2021   No results found for: "LDLDIRECT"  Glucose: Glucose  Date Value Ref Range Status  03/14/2022 81 70 - 99 mg/dL Final  16/05/9603 86 70 - 99 mg/dL Final  54/04/8118 147 (H) 65 - 99 mg/dL Final   Glucose, Bld  Date Value Ref Range Status  08/26/2023 87 65 - 99 mg/dL Final    Comment:    .            Fasting reference interval .   09/17/2022 91 65 - 99 mg/dL Final    Comment:    .            Fasting reference interval .     Patient Active Problem List   Diagnosis Date Noted   Other insomnia 03/18/2023   Other hyperlipidemia 09/14/2022   Family history of colonic polyps 01/17/2021   Uterine leiomyoma 01/17/2021   Need for shingles vaccine 01/17/2021   Right leg swelling 01/17/2021   Urinary incontinence 01/17/2021   Stress incontinence 01/17/2021   Encounter for imaging of bilateral greater saphenous veins 01/17/2021   OSA (obstructive sleep apnea) 01/17/2021   Pure hypercholesterolemia 11/24/2019   COVID-19 virus detected 09/23/2019   Morbid obesity with BMI of 50.0-59.9, adult (HCC) 04/25/2018   Hypertension 04/11/2017   Colon polyp 10/22/2016   Obesity 10/22/2016   Vitamin D deficiency 10/22/2016   Iron deficiency anemia secondary to inadequate dietary iron intake 10/22/2016    Past Surgical History:  Procedure Laterality Date   COLONOSCOPY WITH PROPOFOL     COLONOSCOPY WITH PROPOFOL N/A 03/28/2020   Procedure: COLONOSCOPY WITH PROPOFOL;  Surgeon: Regis Bill, MD;  Location: [facility] ENDOSCOPY;  Service: Endoscopy;  Laterality: N/A;   TUBAL LIGATION      Family History  Problem Relation Age of Onset   Hypertension Mother    Hearing loss Mother    Depression Mother    Arthritis Mother    Kidney disease Father    Hearing loss Father    Diabetes Father    Mental illness Sister    Early death Sister    Mental illness  Sister    Kidney disease Sister    Early death Sister    Diabetes Sister    Mental illness Sister    Mental illness Brother    Arthritis Brother    Kidney disease Brother    Diabetes Brother    Kidney disease Brother  Diabetes Brother    Heart disease Son    Diabetes Son    Breast cancer Neg Hx     Social History   Socioeconomic History   Marital status: Divorced    Spouse name: Not on file   Number of children: Not on file   Years of education: Not on file   Highest education level: Bachelor's degree (e.g., BA, AB, BS)  Occupational History   Not on file  Tobacco Use   Smoking status: Never   Smokeless tobacco: Never  Vaping Use   Vaping status: Never Used  Substance and Sexual Activity   Alcohol use: No   Drug use: Never   Sexual activity: Not Currently  Other Topics Concern   Not on file  Social History Narrative   Not on file   Social Drivers of Health   Financial Resource Strain: Low Risk  (08/25/2023)   Overall Financial Resource Strain (CARDIA)    Difficulty of Paying Living Expenses: Not hard at all  Food Insecurity: No Food Insecurity (08/25/2023)   Hunger Vital Sign    Worried About Running Out of Food in the Last Year: Never true    Ran Out of Food in the Last Year: Never true  Transportation Needs: No Transportation Needs (08/25/2023)   PRAPARE - Administrator, Civil Service (Medical): No    Lack of Transportation (Non-Medical): No  Physical Activity: Insufficiently Active (08/25/2023)   Exercise Vital Sign    Days of Exercise per Week: 3 days    Minutes of Exercise per Session: 10 min  Stress: No Stress Concern Present (08/25/2023)   [employer] of Occupational Health - Occupational Stress Questionnaire    Feeling of Stress : Only a little  Social Connections: Moderately Integrated (08/25/2023)   Social Connection and Isolation Panel [NHANES]    Frequency of Communication with Friends and Family: Three times a week    Frequency  of Social Gatherings with Friends and Family: Twice a week    Attends Religious Services: More than 4 times per year    Active Member of [employer] or Organizations: Yes    Attends Engineer, structural: More than 4 times per year    Marital Status: Divorced  Intimate Partner Violence: Not At Risk (10/01/2023)   Humiliation, Afraid, Rape, and Kick questionnaire    Fear of Current or Ex-Partner: No    Emotionally Abused: No    Physically Abused: No    Sexually Abused: No     Current Outpatient Medications:    Multiple Vitamin (MULTIVITAMIN) tablet, Take 1 tablet by mouth daily., Disp: , Rfl:    hydrochlorothiazide (HYDRODIURIL) 12.5 MG tablet, Take 1 tablet (12.5 mg total) by mouth daily., Disp: 90 tablet, Rfl: 0  No Known Allergies   ROS  Constitutional: Negative for fever or weight change.  Respiratory: Negative for cough and shortness of breath.   Cardiovascular: Negative for chest pain or palpitations.  Gastrointestinal: Negative for abdominal pain, no bowel changes.  Musculoskeletal: Negative for gait problem or joint swelling.  Skin: Negative for rash.  Neurological: Negative for dizziness or headache.  No other specific complaints in a complete review of systems (except as listed in HPI above).   Objective  Vitals:   10/01/23 0759  BP: 128/82  Pulse: 88  Resp: 16  Temp: 98.1 F (36.7 C)  TempSrc: Oral  SpO2: 98%  Weight: 287 lb 11.2 oz (130.5 kg)  Height: 5\' 2"  (1.575 m)  Body mass index is 52.62 kg/m.  Physical Exam Vitals reviewed.  Constitutional:      Appearance: Normal appearance.  HENT:     Head: Normocephalic.     Right Ear: Tympanic membrane normal.     Left Ear: Tympanic membrane normal.     Nose: Nose normal.  Eyes:     Extraocular Movements: Extraocular movements intact.     Conjunctiva/sclera: Conjunctivae normal.     Pupils: Pupils are equal, round, and reactive to light.  Neck:     Thyroid: No thyroid mass, thyromegaly or thyroid  tenderness.  Cardiovascular:     Rate and Rhythm: Normal rate and regular rhythm.     Pulses: Normal pulses.     Heart sounds: Normal heart sounds.  Pulmonary:     Effort: Pulmonary effort is normal.     Breath sounds: Normal breath sounds.  Abdominal:     General: Bowel sounds are normal.     Palpations: Abdomen is soft.  Musculoskeletal:        General: Normal range of motion.     Cervical back: Normal range of motion and neck supple.     Right lower leg: No edema.     Left lower leg: No edema.  Skin:    General: Skin is warm and dry.     Capillary Refill: Capillary refill takes less than 2 seconds.  Neurological:     General: No focal deficit present.     Mental Status: She is alert and oriented to person, place, and time. Mental status is at baseline.  Psychiatric:        Mood and Affect: Mood normal.        Behavior: Behavior normal.        Thought Content: Thought content normal.        Judgment: Judgment normal.      Recent Results (from the past 2160 hours)  CBC with Differential/Platelet     Status: None   Collection Time: 08/26/23  9:34 AM  Result Value Ref Range   WBC 5.9 3.8 - 10.8 Thousand/uL   RBC 4.20 3.80 - 5.10 Million/uL   Hemoglobin 12.4 11.7 - 15.5 g/dL   HCT 16.1 09.6 - 04.5 %   MCV 91.9 80.0 - 100.0 fL   MCH 29.5 27.0 - 33.0 pg   MCHC 32.1 32.0 - 36.0 g/dL    Comment: For adults, a slight decrease in the calculated MCHC value (in the range of 30 to 32 g/dL) is most likely not clinically significant; however, it should be interpreted with caution in correlation with other red cell parameters and the patient's clinical condition.    RDW 12.8 11.0 - 15.0 %   Platelets 294 140 - 400 Thousand/uL   MPV 10.9 7.5 - 12.5 fL   Neutro Abs 3,670 1,500 - 7,800 cells/uL   Absolute Lymphocytes 1,487 850 - 3,900 cells/uL   Absolute Monocytes 513 200 - 950 cells/uL   Eosinophils Absolute 171 15 - 500 cells/uL   Basophils Absolute 59 0 - 200 cells/uL    Neutrophils Relative % 62.2 %   Total Lymphocyte 25.2 %   Monocytes Relative 8.7 %   Eosinophils Relative 2.9 %   Basophils Relative 1.0 %  COMPLETE METABOLIC PANEL WITH GFR     Status: None   Collection Time: 08/26/23  9:34 AM  Result Value Ref Range   Glucose, Bld 87 65 - 99 mg/dL    Comment: .  Fasting reference interval .    BUN 8 7 - 25 mg/dL   Creat 0.98 1.19 - 1.47 mg/dL   eGFR 78 > OR = 60 WG/NFA/2.13Y8   BUN/Creatinine Ratio SEE NOTE: 6 - 22 (calc)    Comment:    Not Reported: BUN and Creatinine are within    reference range. .    Sodium 143 135 - 146 mmol/L   Potassium 4.0 3.5 - 5.3 mmol/L   Chloride 107 98 - 110 mmol/L   CO2 29 20 - 32 mmol/L   Calcium 10.2 8.6 - 10.4 mg/dL   Total Protein 7.3 6.1 - 8.1 g/dL   Albumin 4.2 3.6 - 5.1 g/dL   Globulin 3.1 1.9 - 3.7 g/dL (calc)   AG Ratio 1.4 1.0 - 2.5 (calc)   Total Bilirubin 0.5 0.2 - 1.2 mg/dL   Alkaline phosphatase (APISO) 68 37 - 153 U/L   AST 15 10 - 35 U/L   ALT 11 6 - 29 U/L  TSH     Status: None   Collection Time: 08/26/23  9:34 AM  Result Value Ref Range   TSH 1.72 0.40 - 4.50 mIU/L  Hemoglobin A1c     Status: None   Collection Time: 08/26/23  9:34 AM  Result Value Ref Range   Hgb A1c MFr Bld 5.4 <5.7 % of total Hgb    Comment: For the purpose of screening for the presence of diabetes: . <5.7%       Consistent with the absence of diabetes 5.7-6.4%    Consistent with increased risk for diabetes             (prediabetes) > or =6.5%  Consistent with diabetes . This assay result is consistent with a decreased risk of diabetes. . Currently, no consensus exists regarding use of hemoglobin A1c for diagnosis of diabetes in children. . According to American Diabetes Association (ADA) guidelines, hemoglobin A1c <7.0% represents optimal control in non-pregnant diabetic patients. Different metrics may apply to specific patient populations.  Standards of Medical Care in Diabetes(ADA). .     Mean Plasma Glucose 108 mg/dL   eAG (mmol/L) 6.0 mmol/L  Magnesium     Status: None   Collection Time: 08/26/23  9:34 AM  Result Value Ref Range   Magnesium 2.3 1.5 - 2.5 mg/dL  Iron, TIBC and Ferritin Panel     Status: None   Collection Time: 08/26/23  9:34 AM  Result Value Ref Range   Iron 83 45 - 160 mcg/dL   TIBC 657 846 - 962 mcg/dL (calc)   %SAT 25 16 - 45 % (calc)   Ferritin 138 16 - 288 ng/mL  VITAMIN D 25 Hydroxy (Vit-D Deficiency, Fractures)     Status: None   Collection Time: 08/26/23  9:34 AM  Result Value Ref Range   Vit D, 25-Hydroxy 36 30 - 100 ng/mL    Comment: Vitamin D Status         25-OH Vitamin D: . Deficiency:                    <20 ng/mL Insufficiency:             20 - 29 ng/mL Optimal:                 > or = 30 ng/mL . For 25-OH Vitamin D testing on patients on  D2-supplementation and patients for whom quantitation  of D2 and D3 fractions is required, the [facility]ure[facility](TM)  25-OH VIT D, (D2,D3), LC/MS/MS is recommended: order  code 43329 (patients >50yrs). . See Note 1 . Note 1 . For additional information, please refer to  http://education.[facility].com/faq/FAQ199  (This link is being provided for informational/ educational purposes only.)   Troponin I -     Status: None   Collection Time: 08/26/23  9:34 AM  Result Value Ref Range   Troponin I 6 < OR = 47 ng/L    Comment: . In accord with published recommendations, serial testing of troponin I at intervals of 2 to 4 hours for up to 12 to 24 hours is suggested in order to corroborate a single troponin I result. An elevated troponin alone is not sufficient to make the diagnosis of MI. .   Vitamin B12     Status: None   Collection Time: 08/26/23  9:34 AM  Result Value Ref Range   Vitamin B-12 456 200 - 1,100 pg/mL      Fall Risk:    10/01/2023    7:55 AM 03/18/2023    7:56 AM 09/17/2022    8:38 AM 03/14/2022    8:13 AM 01/17/2021    8:58 AM  Fall Risk   Falls in the past year? 0  0 0 0 0  Number falls in past yr: 0 0 0 0 0  Injury with Fall? 0 0 0 0 0  Risk for fall due to : No Fall Risks   No Fall Risks   Follow up Falls prevention discussed;Education provided;Falls evaluation completed   Falls prevention discussed;Education provided Falls evaluation completed     Functional Status Survey: Is the patient deaf or have difficulty hearing?: No Does the patient have difficulty seeing, even when wearing glasses/contacts?: No Does the patient have difficulty concentrating, remembering, or making decisions?: No Does the patient have difficulty walking or climbing stairs?: No Does the patient have difficulty dressing or bathing?: No Does the patient have difficulty doing errands alone such as visiting a doctor's office or shopping?: No   Assessment & Plan 1. Annual physical exam (Primary) -recently had labs, due for lipid panel -has appointment with cardiology in April discussed they may do labs can wait till then if not done then we can check at next appointment.   2. Encounter for osteoporosis screening in asymptomatic postmenopausal patient  - DG Bone Density; Future  3. Encounter for screening mammogram for malignant neoplasm of breast  - MM 3D SCREENING MAMMOGRAM BILATERAL BREAST; Future  4. Hypertension, unspecified type -needed refill of medication - hydrochlorothiazide (HYDRODIURIL) 12.5 MG tablet; Take 1 tablet (12.5 mg total) by mouth daily.  Dispense: 90 tablet; Refill: 0   -USPSTF grade A and B recommendations reviewed with patient; age-appropriate recommendations, preventive care, screening tests, etc discussed and encouraged; healthy living encouraged; see AVS for patient education given to patient -Discussed importance of 150 minutes of physical activity weekly, eat two servings of fish weekly, eat one serving of tree nuts ( cashews, pistachios, pecans, almonds.Marland Kitchen) every other day, eat 6 servings of fruit/vegetables daily and drink plenty of water  and avoid sweet beverages.   -Reviewed Health Maintenance: yes

## 2023-10-01 ENCOUNTER — Encounter: Payer: Self-pay | Admitting: Nurse Practitioner

## 2023-10-01 ENCOUNTER — Ambulatory Visit (INDEPENDENT_AMBULATORY_CARE_PROVIDER_SITE_OTHER): Payer: 59 | Admitting: Nurse Practitioner

## 2023-10-01 ENCOUNTER — Other Ambulatory Visit: Payer: Self-pay | Admitting: Nurse Practitioner

## 2023-10-01 VITALS — BP 128/82 | HR 88 | Temp 98.1°F | Resp 16 | Ht 62.0 in | Wt 287.7 lb

## 2023-10-01 DIAGNOSIS — Z0001 Encounter for general adult medical examination with abnormal findings: Secondary | ICD-10-CM

## 2023-10-01 DIAGNOSIS — I10 Essential (primary) hypertension: Secondary | ICD-10-CM

## 2023-10-01 DIAGNOSIS — Z13820 Encounter for screening for osteoporosis: Secondary | ICD-10-CM

## 2023-10-01 DIAGNOSIS — Z780 Asymptomatic menopausal state: Secondary | ICD-10-CM

## 2023-10-01 DIAGNOSIS — Z1231 Encounter for screening mammogram for malignant neoplasm of breast: Secondary | ICD-10-CM

## 2023-10-01 DIAGNOSIS — Z0000 Encounter for general adult medical examination without abnormal findings: Secondary | ICD-10-CM

## 2023-10-01 MED ORDER — HYDROCHLOROTHIAZIDE 12.5 MG PO TABS: 12.5000 mg | ORAL_TABLET | Freq: Every day | ORAL | 0 refills | Status: DC

## 2023-10-02 NOTE — Telephone Encounter (Signed)
 Duplicate request, refilled 10/01/23 to [facility].  Requested Prescriptions  Pending Prescriptions Disp Refills   hydrochlorothiazide (HYDRODIURIL) 12.5 MG tablet [Pharmacy Med Name: HYDROCHLOROTHIAZIDE 12.5MG  TABLETS] 90 tablet 0    Sig: TAKE 1 TABLET(12.5 MG) BY MOUTH DAILY     Cardiovascular: Diuretics - Thiazide Passed - 10/02/2023  2:35 PM      Passed - Cr in normal range and within 180 days    Creat  Date Value Ref Range Status  08/26/2023 0.83 0.50 - 1.05 mg/dL Final         Passed - K in normal range and within 180 days    Potassium  Date Value Ref Range Status  08/26/2023 4.0 3.5 - 5.3 mmol/L Final         Passed - Na in normal range and within 180 days    Sodium  Date Value Ref Range Status  08/26/2023 143 135 - 146 mmol/L Final  03/14/2022 140 134 - 144 mmol/L Final         Passed - Last BP in normal range    BP Readings from Last 1 Encounters:  10/01/23 128/82         Passed - Valid encounter within last 6 months    Recent Outpatient Visits           1 month ago Syncope, unspecified syncope type   [facility] Berniece Salines, FNP   6 months ago Hypertension, unspecified type   [facility] Berniece Salines, FNP   1 year ago Annual physical exam   [facility] Berniece Salines, FNP   1 year ago Hypertension, unspecified type   [facility] Berniece Salines, FNP       Future Appointments             In 1 month Agbor-Etang, Arlys John, MD [facility] [place]   In 6 months Zane Herald, Rudolpho Sevin, FNP [facility] [place], [facility]

## 2023-10-02 NOTE — Telephone Encounter (Signed)
 Patient called, left VM to return the call to the office. Will need to verify if she wants hydrochlorothiazide sent to [facility], because yesterday it was sent to [facility].

## 2023-10-07 ENCOUNTER — Institutional Professional Consult (permissible substitution): Payer: 59 | Admitting: Sleep Medicine

## 2023-10-08 NOTE — Progress Notes (Signed)
 ERROR

## 2023-10-16 ENCOUNTER — Encounter: Payer: Self-pay | Admitting: Nurse Practitioner

## 2023-10-16 ENCOUNTER — Telehealth: Payer: Self-pay

## 2023-10-16 NOTE — Telephone Encounter (Signed)
 Called left vm that we received [employer] sleep apnea supply order and we need to know PAP pressure settings

## 2023-11-14 ENCOUNTER — Ambulatory Visit: Payer: Self-pay | Attending: Cardiology | Admitting: Cardiology

## 2023-11-14 ENCOUNTER — Encounter: Payer: Self-pay | Admitting: Cardiology

## 2023-11-14 VITALS — BP 124/82 | HR 78 | Ht 62.0 in | Wt 283.4 lb

## 2023-11-14 DIAGNOSIS — I10 Essential (primary) hypertension: Secondary | ICD-10-CM | POA: Diagnosis not present

## 2023-11-14 DIAGNOSIS — Z6841 Body Mass Index (BMI) 40.0 and over, adult: Secondary | ICD-10-CM

## 2023-11-14 DIAGNOSIS — R55 Syncope and collapse: Secondary | ICD-10-CM | POA: Diagnosis present

## 2023-11-14 DIAGNOSIS — I471 Supraventricular tachycardia, unspecified: Secondary | ICD-10-CM | POA: Diagnosis not present

## 2023-11-14 NOTE — Progress Notes (Signed)
 Cardiology Office Note:    Date:  11/14/2023   ID:  Ravleen, Ries August 04, 1956, MRN [id number]  PCP:  Berniece Salines, FNP   [facility] Providers Cardiologist:  None     Referring MD: Berniece Salines, FNP   No chief complaint on file.  Jennifer Mcbride is a 68 y.o. female who is being seen today for the evaluation of syncope at the request of Berniece Salines, FNP.   History of Present Illness:    Jennifer Mcbride is a 68 y.o. female with a hx of hypertension, morbid obesity, OSA on CPAP presenting with syncope.  Patient passed out 3 months ago while going to work.  She works at [employer] shift, the gentleman helped her up at the time after falling.  She did not have any dizziness, chest pain or shortness of breath prior to passing out.  She went through her shift and then went home.  Followed up with PCP, medications were adjusted, bisoprolol was stopped.  HCTZ 12.5 mg daily was continued.  Cardiac monitor was placed to evaluate any significant arrhythmias.  States undergoing a lot of stress at home with needing to find a place to live.  Checked her blood pressure at home about a month ago, was slightly low with value 90/50.  She stopped HCTZ, blood pressures have been normal, also felt better.  Has not had any further episodes of passing out.  Cardiac monitor 09/2023 showed 26 episodes of nonsustained SVT longest up to 71 beats.  Occasional PVC, PACs.  No A-fib or atrial flutter.    Past Medical History:  Diagnosis Date   Anemia    IDA   Hypertension    Obesity    Urine incontinence    Vitamin D deficiency     Past Surgical History:  Procedure Laterality Date   COLONOSCOPY WITH PROPOFOL     COLONOSCOPY WITH PROPOFOL N/A 03/28/2020   Procedure: COLONOSCOPY WITH PROPOFOL;  Surgeon: Regis Bill, MD;  Location: [facility] ENDOSCOPY;  Service: Endoscopy;  Laterality: N/A;   TUBAL LIGATION      Current Medications: Current Meds  Medication Sig    Multiple Vitamin (MULTIVITAMIN) tablet Take 1 tablet by mouth daily.     Allergies:   Patient has no known allergies.   Social History   Socioeconomic History   Marital status: Divorced    Spouse name: Not on file   Number of children: Not on file   Years of education: Not on file   Highest education level: Bachelor's degree (e.g., BA, AB, BS)  Occupational History   Not on file  Tobacco Use   Smoking status: Never   Smokeless tobacco: Never  Vaping Use   Vaping status: Never Used  Substance and Sexual Activity   Alcohol use: No   Drug use: Never   Sexual activity: Not Currently  Other Topics Concern   Not on file  Social History Narrative   Not on file   Social Drivers of Health   Financial Resource Strain: Low Risk  (08/25/2023)   Overall Financial Resource Strain (CARDIA)    Difficulty of Paying Living Expenses: Not hard at all  Food Insecurity: No Food Insecurity (08/25/2023)   Hunger Vital Sign    Worried About Running Out of Food in the Last Year: Never true    Ran Out of Food in the Last Year: Never true  Transportation Needs: No Transportation Needs (08/25/2023)   PRAPARE - Transportation  Lack of Transportation (Medical): No    Lack of Transportation (Non-Medical): No  Physical Activity: Insufficiently Active (08/25/2023)   Exercise Vital Sign    Days of Exercise per Week: 3 days    Minutes of Exercise per Session: 10 min  Stress: No Stress Concern Present (08/25/2023)   [employer] of Occupational Health - Occupational Stress Questionnaire    Feeling of Stress : Only a little  Social Connections: Moderately Integrated (08/25/2023)   Social Connection and Isolation Panel [NHANES]    Frequency of Communication with Friends and Family: Three times a week    Frequency of Social Gatherings with Friends and Family: Twice a week    Attends Religious Services: More than 4 times per year    Active Member of [employer] or Organizations: Yes    Attends Museum/gallery exhibitions officer: More than 4 times per year    Marital Status: Divorced     Family History: The patient's family history includes Arthritis in her brother and mother; Depression in her mother; Diabetes in her brother, brother, father, sister, and son; Early death in her sister and sister; Hearing loss in her father and mother; Heart disease in her son; Hypertension in her mother; Kidney disease in her brother, brother, father, and sister; Mental illness in her brother, sister, sister, and sister. There is no history of Breast cancer.  ROS:   Please see the history of present illness.     All other systems reviewed and are negative.  EKGs/Labs/Other Studies Reviewed:    The following studies were reviewed today:       Recent Labs: 08/26/2023: ALT 11; BUN 8; Creat 0.83; Hemoglobin 12.4; Magnesium 2.3; Platelets 294; Potassium 4.0; Sodium 143; TSH 1.72  Recent Lipid Panel    Component Value Date/Time   CHOL 195 09/17/2022 0924   CHOL 184 03/14/2022 0913   TRIG 77 09/17/2022 0924   HDL 70 09/17/2022 0924   HDL 71 03/14/2022 0913   CHOLHDL 2.8 09/17/2022 0924   LDLCALC 108 (H) 09/17/2022 0924     Risk Assessment/Calculations:             Physical Exam:    VS:  BP 124/82   Pulse 78   Ht 5\' 2"  (1.575 m)   Wt 283 lb 6.4 oz (128.5 kg)   SpO2 94%   BMI 51.83 kg/m     Wt Readings from Last 3 Encounters:  11/14/23 283 lb 6.4 oz (128.5 kg)  10/01/23 287 lb 11.2 oz (130.5 kg)  08/26/23 292 lb 9.6 oz (132.7 kg)     GEN:  Well nourished, well developed in no acute distress HEENT: Normal NECK: No JVD; No carotid bruits CARDIAC: RRR, no murmurs, rubs, gallops RESPIRATORY:  Clear to auscultation without rales, wheezing or rhonchi  ABDOMEN: Soft, non-tender, non-distended MUSCULOSKELETAL:  No edema; No deformity  SKIN: Warm and dry NEUROLOGIC:  Alert and oriented x 3 PSYCHIATRIC:  Normal affect   ASSESSMENT:    1. Syncope, unspecified syncope type   2. Paroxysmal  SVT (supraventricular tachycardia) (HCC)   3. Primary hypertension   4. Morbid obesity with BMI of 50.0-59.9, adult (HCC)    PLAN:    In order of problems listed above:  Syncope, could be orthostatic due to low BP.  Overall feeling better since stopping BP meds.  Cardiac monitor showed paroxysmal SVT.  Obtain echocardiogram to rule out any structural abnormalities. Paroxysmal SVT, no symptoms of palpitations.  Due to history of low BP and  patient largely being asymptomatic we will hold off on AV nodal agents.  Monitor patient. Hypertension, BP control off antihypertensives.  Continue low-salt diet. Morbid obesity, low-calorie diet, weight loss, increased activity advised.  Follow-up in 3 months after echo     Medication Adjustments/Labs and Tests Ordered: Current medicines are reviewed at length with the patient today.  Concerns regarding medicines are outlined above.  Orders Placed This Encounter  Procedures   ECHOCARDIOGRAM COMPLETE   No orders of the defined types were placed in this encounter.   Patient Instructions    Testing/Procedures:  Your physician has requested that you have an echocardiogram. Echocardiography is a painless test that uses sound waves to create images of your heart. It provides your doctor with information about the size and shape of your heart and how well your heart's chambers and valves are working. This procedure takes approximately one hour. There are no restrictions for this procedure. Please do NOT wear cologne, perfume, aftershave, or lotions (deodorant is allowed). Please arrive 15 minutes prior to your appointment time.  Please note: We ask at that you not bring children with you during ultrasound (echo/ vascular) testing. Due to room size and safety concerns, children are not allowed in the ultrasound rooms during exams. Our front office staff cannot provide observation of children in our lobby area while testing is being conducted. An adult  accompanying a patient to their appointment will only be allowed in the ultrasound room at the discretion of the ultrasound technician under special circumstances. We apologize for any inconvenience.   Follow-Up: At [facility], you and your health needs are our priority.  As part of our continuing mission to provide you with exceptional heart care, our providers are all part of one team.  This team includes your primary Cardiologist (physician) and Advanced Practice Providers or APPs (Physician Assistants and Nurse Practitioners) who all work together to provide you with the care you need, when you need it.  Your next appointment:   3 month(s)  Provider:   You may see DR Azucena Cecil or one of the following Advanced Practice Providers on your designated Care Team:   Nicolasa Ducking, NP Ames Dura, PA-C Eula Listen, PA-C Cadence Fransico Michael, PA-C Charlsie Quest, NP Carlos Levering, NP                     Signed, Debbe Odea, MD  11/14/2023 10:33 AM    [facility]

## 2023-11-14 NOTE — Patient Instructions (Addendum)
   Testing/Procedures:  Your physician has requested that you have an echocardiogram. Echocardiography is a painless test that uses sound waves to create images of your heart. It provides your doctor with information about the size and shape of your heart and how well your heart's chambers and valves are working. This procedure takes approximately one hour. There are no restrictions for this procedure. Please do NOT wear cologne, perfume, aftershave, or lotions (deodorant is allowed). Please arrive 15 minutes prior to your appointment time.  Please note: We ask at that you not bring children with you during ultrasound (echo/ vascular) testing. Due to room size and safety concerns, children are not allowed in the ultrasound rooms during exams. Our front office staff cannot provide observation of children in our lobby area while testing is being conducted. An adult accompanying a patient to their appointment will only be allowed in the ultrasound room at the discretion of the ultrasound technician under special circumstances. We apologize for any inconvenience.   Follow-Up: At [facility], you and your health needs are our priority.  As part of our continuing mission to provide you with exceptional heart care, our providers are all part of one team.  This team includes your primary Cardiologist (physician) and Advanced Practice Providers or APPs (Physician Assistants and Nurse Practitioners) who all work together to provide you with the care you need, when you need it.  Your next appointment:   3 month(s)  Provider:   You may see DR Azucena Cecil or one of the following Advanced Practice Providers on your designated Care Team:   Nicolasa Ducking, NP Ames Dura, PA-C Eula Listen, PA-C Cadence [place], PA-C Charlsie Quest, NP Carlos Levering, NP

## 2023-12-10 ENCOUNTER — Ambulatory Visit: Attending: Cardiology

## 2023-12-10 DIAGNOSIS — R55 Syncope and collapse: Secondary | ICD-10-CM

## 2023-12-10 LAB — ECHOCARDIOGRAM COMPLETE
AR max vel: 2.31 cm2
AV Area VTI: 2.41 cm2
AV Area mean vel: 2.12 cm2
AV Mean grad: 5 mmHg
AV Peak grad: 9.4 mmHg
Ao pk vel: 1.53 m/s
Area-P 1/2: 3.21 cm2
S' Lateral: 3.18 cm

## 2023-12-16 ENCOUNTER — Ambulatory Visit: Admitting: General Practice

## 2023-12-24 ENCOUNTER — Encounter (INDEPENDENT_AMBULATORY_CARE_PROVIDER_SITE_OTHER): Payer: Self-pay

## 2023-12-30 ENCOUNTER — Other Ambulatory Visit: Payer: Self-pay | Admitting: Nurse Practitioner

## 2023-12-30 DIAGNOSIS — I10 Essential (primary) hypertension: Secondary | ICD-10-CM

## 2024-01-01 NOTE — Telephone Encounter (Signed)
 Requested by interface surescripts. Future visit in 3 months.  Requested Prescriptions  Pending Prescriptions Disp Refills   hydrochlorothiazide  (HYDRODIURIL ) 12.5 MG tablet [Pharmacy Med Name: HYDROCHLOROTHIAZIDE  12.5 MG TB] 90 tablet 0    Sig: TAKE 1 TABLET BY MOUTH EVERY DAY     Cardiovascular: Diuretics - Thiazide Passed - 01/01/2024  4:25 PM      Passed - Cr in normal range and within 180 days    Creat  Date Value Ref Range Status  08/26/2023 0.83 0.50 - 1.05 mg/dL Final         Passed - K in normal range and within 180 days    Potassium  Date Value Ref Range Status  08/26/2023 4.0 3.5 - 5.3 mmol/L Final         Passed - Na in normal range and within 180 days    Sodium  Date Value Ref Range Status  08/26/2023 143 135 - 146 mmol/L Final  03/14/2022 140 134 - 144 mmol/L Final         Passed - Last BP in normal range    BP Readings from Last 1 Encounters:  11/14/23 124/82         Passed - Valid encounter within last 6 months    Recent Outpatient Visits           3 months ago Annual physical exam   [facility] Quinton Buckler, FNP       Future Appointments             In 1 month Marvel Slicker, Eugene Hertz, NP [facility] [place]   In 3 months Abram Hoguet, Monalisa Angles, FNP [facility]

## 2024-02-13 ENCOUNTER — Ambulatory Visit: Attending: Nurse Practitioner | Admitting: Nurse Practitioner

## 2024-02-13 ENCOUNTER — Encounter: Payer: Self-pay | Admitting: Nurse Practitioner

## 2024-02-13 VITALS — BP 120/80 | HR 95 | Ht 62.0 in | Wt 282.0 lb

## 2024-02-13 DIAGNOSIS — I341 Nonrheumatic mitral (valve) prolapse: Secondary | ICD-10-CM

## 2024-02-13 DIAGNOSIS — R002 Palpitations: Secondary | ICD-10-CM | POA: Diagnosis not present

## 2024-02-13 DIAGNOSIS — R55 Syncope and collapse: Secondary | ICD-10-CM | POA: Diagnosis not present

## 2024-02-13 DIAGNOSIS — I471 Supraventricular tachycardia, unspecified: Secondary | ICD-10-CM

## 2024-02-13 DIAGNOSIS — I340 Nonrheumatic mitral (valve) insufficiency: Secondary | ICD-10-CM | POA: Diagnosis not present

## 2024-02-13 NOTE — Patient Instructions (Signed)
 Medication Instructions:  Your physician recommends that you continue on your current medications as directed. Please refer to the Current Medication list given to you today.   *If you need a refill on your cardiac medications before your next appointment, please call your pharmacy*  Lab Work: None ordered at this time  If you have labs (blood work) drawn today and your tests are completely normal, you will receive your results only by: MyChart Message (if you have MyChart) OR A paper copy in the mail If you have any lab test that is abnormal or we need to change your treatment, we will call you to review the results.  Testing/Procedures: Your physician has requested that you have an echocardiogram in ONE YEAR. Echocardiography is a painless test that uses sound waves to create images of your heart. It provides your doctor with information about the size and shape of your heart and how well your heart's chambers and valves are working.   You may receive an ultrasound enhancing agent through an IV if needed to better visualize your heart during the echo. This procedure takes approximately one hour.  There are no restrictions for this procedure.  This will take place at [address] [facility] [address], [place] [address]  Please note: We ask at that you not bring children with you during ultrasound (echo/ vascular) testing. Due to room size and safety concerns, children are not allowed in the ultrasound rooms during exams. Our front office staff cannot provide observation of children in our lobby area while testing is being conducted. An adult accompanying a patient to their appointment will only be allowed in the ultrasound room at the discretion of the ultrasound technician under special circumstances. We apologize for any inconvenience.   Follow-Up: At [facility], you and your health needs are our priority.  As part of our continuing mission to provide you with  exceptional heart care, our providers are all part of one team.  This team includes your primary Cardiologist (physician) and Advanced Practice Providers or APPs (Physician Assistants and Nurse Practitioners) who all work together to provide you with the care you need, when you need it.  Your next appointment:   1 year(s)  Provider:   You may see Redell Cave, MD or Lonni Meager, NP  Other Instructions

## 2024-02-13 NOTE — Patient Instructions (Addendum)
 Medication Instructions:  Your physician recommends that you continue on your current medications as directed. Please refer to the Current Medication list given to you today.   *If you need a refill on your cardiac medications before your next appointment, please call your pharmacy*  Lab Work: None ordered at this time   Testing/Procedures: Your physician has requested that you have an echocardiogram in ONE YEAR. Echocardiography is a painless test that uses sound waves to create images of your heart. It provides your doctor with information about the size and shape of your heart and how well your heart's chambers and valves are working.   You may receive an ultrasound enhancing agent through an IV if needed to better visualize your heart during the echo. This procedure takes approximately one hour.  There are no restrictions for this procedure.  This will take place at [address] [facility] [address], [place] [address]  Please note: We ask at that you not bring children with you during ultrasound (echo/ vascular) testing. Due to room size and safety concerns, children are not allowed in the ultrasound rooms during exams. Our front office staff cannot provide observation of children in our lobby area while testing is being conducted. An adult accompanying a patient to their appointment will only be allowed in the ultrasound room at the discretion of the ultrasound technician under special circumstances. We apologize for any inconvenience.   Follow-Up: At [facility], you and your health needs are our priority.  As part of our continuing mission to provide you with exceptional heart care, our providers are all part of one team.  This team includes your primary Cardiologist (physician) and Advanced Practice Providers or APPs (Physician Assistants and Nurse Practitioners) who all work together to provide you with the care you need, when you need it.  Your next  appointment:   1 year(s)  Provider:   You may see Redell Cave, MD or Lonni Meager, NP  Other Instructions

## 2024-02-13 NOTE — Progress Notes (Signed)
 Office Visit    Patient Name: Jennifer Mcbride Date of Encounter: 02/13/2024  Primary Care Provider:  Gareth Mliss FALCON, FNP Primary Cardiologist:  Redell Cave, MD  Cardiology APP:  Vivienne Lonni Ingle, NP   Chief Complaint    68 y.o. female with a history of hypertension, syncope, obesity, sleep apnea on CPAP, and iron deficiency anemia, who presents for follow-up related to syncope.  Past Medical History   Subjective   Past Medical History:  Diagnosis Date   Hypertension    Iron deficiency anemia    IDA   Moderate mitral regurgitation    a. 12/2023 Echo: EF 60-65%, no rwma, nl RV size/fxn, mod MR, mild late systolic prolapse of both leaflets of MV.   Obesity    OSA (obstructive sleep apnea)    PSVT (paroxysmal supraventricular tachycardia) (HCC)    a. 08/2023 Zio: Predominantly sinus rhythm, average 66 (41-167). 26 runs of SVT, longest 71 beats. Rare PACs/PVCs. No sustained arrhythmias or pauses.   Syncope    a. 08/2023 Zio: No sustained arrhythmias or pauses.   Urine incontinence    Vitamin D  deficiency    Past Surgical History:  Procedure Laterality Date   COLONOSCOPY WITH PROPOFOL      COLONOSCOPY WITH PROPOFOL  N/A 03/28/2020   Procedure: COLONOSCOPY WITH PROPOFOL ;  Surgeon: Maryruth Ole DASEN, MD;  Location: [facility] ENDOSCOPY;  Service: Endoscopy;  Laterality: N/A;   TUBAL LIGATION      Allergies  No Known Allergies     History of Present Illness      68 y.o. y/o female with a history of hypertension, syncope, obesity, sleep apnea on CPAP, and iron deficiency anemia.  In February 2025, she experienced a syncopal episode while walking to work.  There was no prodrome.  She subsequently followed up with her primary care provider and beta-blocker was discontinued.  Event monitoring was performed and showed predominantly sinus rhythm with 26 brief runs of nonsustained SVT, the longest lasting 71 beats.  She had rare PACs and PVCs but overall no sustained  arrhythmias or pauses.  She established care with Dr. Cave in May 2025.  As PSVT was asymptomatic, she remain off of beta-blocker.  An echocardiogram was performed and showed an EF of 60 to 65% without regional wall motion abnormalities, normal RV size/function, mildly dilated left atrium, and moderate MR with mild late systolic prolapse of both leaflets of the mitral valve.   Since her last visit, Jennifer Mcbride has felt well.  She occasionally notes a skipped heartbeat but no sustained arrhythmias.  She has not had any recurrence of presyncope or syncope.  She is not exercising but is interested in weight loss and plans to discuss possibly using a GLP-1 agonist with her primary care provider.  She denies chest pain, dyspnea, PND, orthopnea, dizziness, syncope, edema, or early satiety. Objective   Home Medications    Current Outpatient Medications  Medication Sig Dispense Refill   Multiple Vitamin (MULTIVITAMIN) tablet Take 1 tablet by mouth daily.     hydrochlorothiazide  (HYDRODIURIL ) 12.5 MG tablet TAKE 1 TABLET BY MOUTH EVERY DAY (Patient not taking: Reported on 02/13/2024) 90 tablet 0   No current facility-administered medications for this visit.     Physical Exam    VS:  BP 120/80 (BP Location: Left Arm, Patient Position: Sitting, Cuff Size: Large)   Pulse 95   Ht 5' 2 (1.575 m)   Wt 282 lb (127.9 kg)   SpO2 98%   BMI 51.58  kg/m  , BMI Body mass index is 51.58 kg/m.          GEN: Well nourished, well developed, in no acute distress. HEENT: normal. Neck: Supple, no JVD, carotid bruits, or masses. Cardiac: RRR, no murmurs, rubs, or gallops. No clubbing, cyanosis, edema.  Radials 2+/PT 2+ and equal bilaterally.  Respiratory:  Respirations regular and unlabored, clear to auscultation bilaterally. GI: Soft, nontender, nondistended, BS + x 4. MS: no deformity or atrophy. Skin: warm and dry, no rash. Neuro:  Strength and sensation are intact. Psych: Normal affect.  Accessory  Clinical Findings    Lab Results  Component Value Date   WBC 5.9 08/26/2023   HGB 12.4 08/26/2023   HCT 38.6 08/26/2023   MCV 91.9 08/26/2023   PLT 294 08/26/2023   Lab Results  Component Value Date   CREATININE 0.83 08/26/2023   BUN 8 08/26/2023   NA 143 08/26/2023   K 4.0 08/26/2023   CL 107 08/26/2023   CO2 29 08/26/2023   Lab Results  Component Value Date   ALT 11 08/26/2023   AST 15 08/26/2023   ALKPHOS 82 03/14/2022   BILITOT 0.5 08/26/2023   Lab Results  Component Value Date   CHOL 195 09/17/2022   HDL 70 09/17/2022   LDLCALC 108 (H) 09/17/2022   TRIG 77 09/17/2022   CHOLHDL 2.8 09/17/2022    Lab Results  Component Value Date   HGBA1C 5.4 08/26/2023   Lab Results  Component Value Date   TSH 1.72 08/26/2023       Assessment & Plan    1.  Presyncope/syncope: Patient suffered a syncopal spell in February 2025.  Subsequent event monitoring did not show any significant arrhythmias to account for symptoms.  No recurrent symptoms.  Recent echo showed normal LV function with moderate MR and mild MVP.  No further workup of syncope at this time.  She is no longer on beta-blocker or HCTZ.  2.  Palpitations/PSVT: Event monitoring in February 2025 showed 26 brief runs of SVT with rare PACs and PVCs.  Patient was asymptomatic during monitoring period.  She was previously on beta-blocker which was discontinued in the setting of presyncope/syncope.  In the absence of symptoms, we have held off on resuming.  She occasionally notes a brief flutter or palpitation but no sustained tachycardias.  3.  Moderate mitral regurgitation/mild mitral valve prolapse: Recent echocardiogram with moderate MR and mild mitral valve prolapse.  Discussed this today.  Will plan on follow-up echocardiogram in a year.  4.  Morbid obesity: Patient is interested in weight loss.  We discussed diet and exercise today.  She is not currently exercising and I strongly encouraged 30 minutes of moderate  exercise at least 5 times per week.  Also discussed the importance of a balanced diet that is high in fruits and vegetables, fiber/grains, and minimizes lean meats.  Handouts from the [employer] of lifestyle medicine provided.  Referral to weight loss clinic in [place] offered.  5.  Disposition: Plan for follow-up echo and office follow-up in 1 year.  Lonni Meager, NP 02/13/2024, 4:58 PM

## 2024-03-31 ENCOUNTER — Encounter: Payer: Self-pay | Admitting: Nurse Practitioner

## 2024-03-31 ENCOUNTER — Ambulatory Visit (INDEPENDENT_AMBULATORY_CARE_PROVIDER_SITE_OTHER): Payer: 59 | Admitting: Nurse Practitioner

## 2024-03-31 VITALS — BP 136/70 | HR 77 | Temp 97.8°F | Resp 18 | Ht 62.0 in | Wt 284.5 lb

## 2024-03-31 DIAGNOSIS — G4733 Obstructive sleep apnea (adult) (pediatric): Secondary | ICD-10-CM

## 2024-03-31 DIAGNOSIS — E7849 Other hyperlipidemia: Secondary | ICD-10-CM

## 2024-03-31 DIAGNOSIS — Z6841 Body Mass Index (BMI) 40.0 and over, adult: Secondary | ICD-10-CM

## 2024-03-31 DIAGNOSIS — I10 Essential (primary) hypertension: Secondary | ICD-10-CM | POA: Diagnosis not present

## 2024-03-31 DIAGNOSIS — Z131 Encounter for screening for diabetes mellitus: Secondary | ICD-10-CM

## 2024-03-31 NOTE — Patient Instructions (Addendum)
 [facility] Address: [address] [place], [place], [place] [address] Phone: [phone]

## 2024-03-31 NOTE — Progress Notes (Signed)
 BP 136/70   Pulse 77   Temp 97.8 F (36.6 C)   Resp 18   Ht 5' 2 (1.575 m)   Wt 284 lb 8 oz (129 kg)   SpO2 99%   BMI 52.04 kg/m    Subjective:    Patient ID: Jennifer Mcbride, female    DOB: January 19, 1956, 68 y.o.   MRN: [id number]  HPI: Jennifer Mcbride is a 68 y.o. female  Chief Complaint  Patient presents with   Medical Management of Chronic Issues   Hypertension    Discussed the use of AI scribe software for clinical note transcription with the patient, who gave verbal consent to proceed.  History of Present Illness Jennifer Mcbride is a 68 year old female who presents for her six-month follow-up.  Obesity and weight management - Current weight is 284 pounds with a BMI of 52.04 - Motivated to work on [employer] management - Exploring various options for weight loss, including a program in [place]  Hypertension - No longer taking antihypertensive medication  Sleep quality - Improved sleep since retirement - History of sleep apnea  Mental health adjustment to retirement - Adjusting to retirement - Interested in finding a therapist to assist with this transition - Previously found phone therapy helpful during COVID  Cardiac history - History of moderate mitral regurgitation and mitral valve prolapse  Hyperlipidemia - History of hyperlipidemia  Laboratory findings - Last vitamin D  and A1c levels were normal    Body mass index is 52.04 kg/m.  Filed Weights   03/31/24 0803  Weight: 284 lb 8 oz (129 kg)        03/31/2024    8:19 AM 10/01/2023    8:13 AM 08/26/2023    9:35 AM  Depression screen PHQ 2/9  Decreased Interest 0 0 0  Down, Depressed, Hopeless 0 0 0  PHQ - 2 Score 0 0 0  Altered sleeping 0 0   Tired, decreased energy 0 0   Change in appetite 0 0   Feeling bad or failure about yourself  0 0   Trouble concentrating 0 0   Moving slowly or fidgety/restless 0 0   Suicidal thoughts 0 0   PHQ-9 Score 0 0   Difficult doing work/chores  Not difficult at all Not difficult at all     Relevant past medical, surgical, family and social history reviewed and updated as indicated. Interim medical history since our last visit reviewed. Allergies and medications reviewed and updated.  Review of Systems  Constitutional: Negative for fever or weight change.  Respiratory: Negative for cough and shortness of breath.   Cardiovascular: Negative for chest pain or palpitations.  Gastrointestinal: Negative for abdominal pain, no bowel changes.  Musculoskeletal: Negative for gait problem or joint swelling.  Skin: Negative for rash.  Neurological: Negative for dizziness or headache.  No other specific complaints in a complete review of systems (except as listed in HPI above).      Objective:     BP 136/70   Pulse 77   Temp 97.8 F (36.6 C)   Resp 18   Ht 5' 2 (1.575 m)   Wt 284 lb 8 oz (129 kg)   SpO2 99%   BMI 52.04 kg/m    Wt Readings from Last 3 Encounters:  03/31/24 284 lb 8 oz (129 kg)  02/13/24 282 lb (127.9 kg)  11/14/23 283 lb 6.4 oz (128.5 kg)    Physical Exam Physical Exam VITALS: BP- 136/70 MEASUREMENTS:  Weight- 284, BMI- 52.04. GENERAL: Alert, cooperative, well developed, no acute distress HEENT: Normocephalic, normal oropharynx, moist mucous membranes CHEST: Clear to auscultation bilaterally, no wheezes, rhonchi, or crackles CARDIOVASCULAR: Normal heart rate and rhythm, S1 and S2 normal without murmurs ABDOMEN: Soft, non-tender, non-distended, without organomegaly, normal bowel sounds EXTREMITIES: No cyanosis or edema NEUROLOGICAL: Cranial nerves grossly intact, moves all extremities without gross motor or sensory deficit   Results for orders placed or performed in visit on 12/10/23  ECHOCARDIOGRAM COMPLETE   Collection Time: 12/10/23  8:53 AM  Result Value Ref Range   AR max vel 2.31 cm2   AV Peak grad 9.4 mmHg   Ao pk vel 1.53 m/s   S' Lateral 3.18 cm   Area-P 1/2 3.21 cm2   AV Area VTI 2.41 cm2    AV Mean grad 5.0 mmHg   AV Area mean vel 2.12 cm2   Est EF 60 - 65%           Assessment & Plan:   Problem List Items Addressed This Visit       Cardiovascular and Mediastinum   Hypertension - Primary   Relevant Orders   CBC with Differential/Platelet   Comprehensive metabolic panel with GFR     Respiratory   OSA (obstructive sleep apnea)     Other   Morbid obesity with BMI of 50.0-59.9, adult (HCC)   Relevant Orders   TSH   Amb Ref to Medical Weight Management   Other hyperlipidemia   Relevant Orders   Lipid panel   Other Visit Diagnoses       Screening for diabetes mellitus       Relevant Orders   Comprehensive metabolic panel with GFR   Hemoglobin A1c        Assessment and Plan Assessment & Plan Obesity Obesity with a BMI of 52.04. She is motivated to pursue weight loss without pharmacological interventions and is interested in nutritional counseling and weight loss programs. - Refer to a nutritionist for dietary guidance. - Provide information on [facility] program in [place], which includes an obesity specialist, nutritionist, and psychologist, offering comprehensive testing and personalized diet plans.  Hypertension Blood pressure is 136/70 mmHg and well-controlled without antihypertensive medication.  Obstructive sleep apnea No changes in management during this visit.  Hyperlipidemia No changes in management during this visit.  Mitral valve prolapse with mitral regurgitation Previously evaluated by cardiology with a recent echocardiogram showing no concerning findings. Cardiologist advised against previous medication.        Follow up plan: Return in about 6 months (around 10/01/2024) for follow up.

## 2024-04-01 ENCOUNTER — Ambulatory Visit: Payer: Self-pay | Admitting: Nurse Practitioner

## 2024-04-01 LAB — COMPREHENSIVE METABOLIC PANEL WITH GFR
AG Ratio: 1.4 (calc) (ref 1.0–2.5)
ALT: 11 U/L (ref 6–29)
AST: 13 U/L (ref 10–35)
Albumin: 4.1 g/dL (ref 3.6–5.1)
Alkaline phosphatase (APISO): 71 U/L (ref 37–153)
BUN/Creatinine Ratio: 9 (calc) (ref 6–22)
BUN: 6 mg/dL — ABNORMAL LOW (ref 7–25)
CO2: 28 mmol/L (ref 20–32)
Calcium: 9.6 mg/dL (ref 8.6–10.4)
Chloride: 108 mmol/L (ref 98–110)
Creat: 0.69 mg/dL (ref 0.50–1.05)
Globulin: 2.9 g/dL (ref 1.9–3.7)
Glucose, Bld: 86 mg/dL (ref 65–99)
Potassium: 4.1 mmol/L (ref 3.5–5.3)
Sodium: 142 mmol/L (ref 135–146)
Total Bilirubin: 0.6 mg/dL (ref 0.2–1.2)
Total Protein: 7 g/dL (ref 6.1–8.1)
eGFR: 96 mL/min/1.73m2 (ref >=60)

## 2024-04-01 LAB — CBC WITH DIFFERENTIAL/PLATELET
Absolute Lymphocytes: 1290 {cells}/uL (ref 850–3900)
Absolute Monocytes: 525 {cells}/uL (ref 200–950)
Basophils Absolute: 52 {cells}/uL (ref 0–200)
Basophils Relative: 1 %
Eosinophils Absolute: 177 {cells}/uL (ref 15–500)
Eosinophils Relative: 3.4 %
HCT: 37.2 % (ref 35.0–45.0)
Hemoglobin: 12 g/dL (ref 11.7–15.5)
MCH: 30 pg (ref 27.0–33.0)
MCHC: 32.3 g/dL (ref 32.0–36.0)
MCV: 93 fL (ref 80.0–100.0)
MPV: 9.9 fL (ref 7.5–12.5)
Monocytes Relative: 10.1 %
Neutro Abs: 3156 {cells}/uL (ref 1500–7800)
Neutrophils Relative %: 60.7 %
Platelets: 288 Thousand/uL (ref 140–400)
RBC: 4 Million/uL (ref 3.80–5.10)
RDW: 12.7 % (ref 11.0–15.0)
Total Lymphocyte: 24.8 %
WBC: 5.2 Thousand/uL (ref 3.8–10.8)

## 2024-04-01 LAB — LIPID PANEL
Cholesterol: 191 mg/dL (ref <200)
HDL: 64 mg/dL (ref >=50)
LDL Cholesterol (Calc): 109 mg/dL — ABNORMAL HIGH
Non-HDL Cholesterol (Calc): 127 mg/dL (ref <130)
Total CHOL/HDL Ratio: 3 (calc) (ref <5.0)
Triglycerides: 87 mg/dL (ref <150)

## 2024-04-01 LAB — TSH: TSH: 1.97 m[IU]/L (ref 0.40–4.50)

## 2024-04-01 LAB — HEMOGLOBIN A1C
Hgb A1c MFr Bld: 5.2 % (ref <5.7)
Mean Plasma Glucose: 103 mg/dL
eAG (mmol/L): 5.7 mmol/L

## 2024-04-08 ENCOUNTER — Emergency Department

## 2024-04-08 ENCOUNTER — Other Ambulatory Visit: Payer: Self-pay

## 2024-04-08 ENCOUNTER — Observation Stay
Admission: EM | Admit: 2024-04-08 | Discharge: 2024-04-09 | Disposition: A | Discharge status: Home / Self Care | Attending: Student | Admitting: Student

## 2024-04-08 ENCOUNTER — Observation Stay

## 2024-04-08 DIAGNOSIS — Z6841 Body Mass Index (BMI) 40.0 and over, adult: Secondary | ICD-10-CM | POA: Diagnosis not present

## 2024-04-08 DIAGNOSIS — E559 Vitamin D deficiency, unspecified: Secondary | ICD-10-CM | POA: Insufficient documentation

## 2024-04-08 DIAGNOSIS — G4733 Obstructive sleep apnea (adult) (pediatric): Secondary | ICD-10-CM | POA: Diagnosis not present

## 2024-04-08 DIAGNOSIS — G459 Transient cerebral ischemic attack, unspecified: Secondary | ICD-10-CM | POA: Diagnosis not present

## 2024-04-08 DIAGNOSIS — G4089 Other seizures: Secondary | ICD-10-CM | POA: Diagnosis not present

## 2024-04-08 DIAGNOSIS — G40909 Epilepsy, unspecified, not intractable, without status epilepticus: Principal | ICD-10-CM | POA: Insufficient documentation

## 2024-04-08 DIAGNOSIS — E785 Hyperlipidemia, unspecified: Secondary | ICD-10-CM | POA: Insufficient documentation

## 2024-04-08 DIAGNOSIS — Z7982 Long term (current) use of aspirin: Secondary | ICD-10-CM | POA: Diagnosis not present

## 2024-04-08 DIAGNOSIS — E669 Obesity, unspecified: Secondary | ICD-10-CM | POA: Insufficient documentation

## 2024-04-08 DIAGNOSIS — I10 Essential (primary) hypertension: Secondary | ICD-10-CM | POA: Diagnosis not present

## 2024-04-08 DIAGNOSIS — R29810 Facial weakness: Secondary | ICD-10-CM | POA: Diagnosis present

## 2024-04-08 DIAGNOSIS — R2990 Unspecified symptoms and signs involving the nervous system: Secondary | ICD-10-CM | POA: Diagnosis present

## 2024-04-08 DIAGNOSIS — R569 Unspecified convulsions: Secondary | ICD-10-CM | POA: Insufficient documentation

## 2024-04-08 LAB — COMPREHENSIVE METABOLIC PANEL WITH GFR
ALT: 15 U/L (ref 0–44)
AST: 17 U/L (ref 15–41)
Albumin: 4 g/dL (ref 3.5–5.0)
Alkaline Phosphatase: 72 U/L (ref 38–126)
Anion gap: 11 (ref 5–15)
BUN: 12 mg/dL (ref 8–23)
CO2: 21 mmol/L — ABNORMAL LOW (ref 22–32)
Calcium: 9.7 mg/dL (ref 8.9–10.3)
Chloride: 109 mmol/L (ref 98–111)
Creatinine, Ser: 0.78 mg/dL (ref 0.44–1.00)
GFR, Estimated: >60 mL/min (ref >60)
Glucose, Bld: 103 mg/dL — ABNORMAL HIGH (ref 70–99)
Potassium: 3.7 mmol/L (ref 3.5–5.1)
Sodium: 141 mmol/L (ref 135–145)
Total Bilirubin: 1 mg/dL (ref 0.0–1.2)
Total Protein: 7.9 g/dL (ref 6.5–8.1)

## 2024-04-08 LAB — CBC
HCT: 36.8 % (ref 36.0–46.0)
Hemoglobin: 12 g/dL (ref 12.0–15.0)
MCH: 29.8 pg (ref 26.0–34.0)
MCHC: 32.6 g/dL (ref 30.0–36.0)
MCV: 91.3 fL (ref 80.0–100.0)
Platelets: 267 K/uL (ref 150–400)
RBC: 4.03 MIL/uL (ref 3.87–5.11)
RDW: 12.8 % (ref 11.5–15.5)
WBC: 6.7 K/uL (ref 4.0–10.5)
nRBC: 0 % (ref 0.0–0.2)

## 2024-04-08 LAB — URINALYSIS, ROUTINE W REFLEX MICROSCOPIC
Bilirubin Urine: NEGATIVE
Glucose, UA: NEGATIVE mg/dL
Hgb urine dipstick: NEGATIVE
Ketones, ur: NEGATIVE mg/dL
Leukocytes,Ua: NEGATIVE
Nitrite: NEGATIVE
Protein, ur: NEGATIVE mg/dL
Specific Gravity, Urine: 1.011 (ref 1.005–1.030)
pH: 5 (ref 5.0–8.0)

## 2024-04-08 LAB — TROPONIN I (HIGH SENSITIVITY)
Troponin I (High Sensitivity): 3 ng/L (ref <18)
Troponin I (High Sensitivity): 2 ng/L (ref <18)

## 2024-04-08 LAB — CK: Total CK: 72 U/L (ref 38–234)

## 2024-04-08 LAB — CBG MONITORING, ED: Glucose-Capillary: 83 mg/dL (ref 70–99)

## 2024-04-08 MED ORDER — ACETAMINOPHEN 160 MG/5ML PO SOLN
650.0000 mg | ORAL | Status: DC | PRN
Start: 2024-04-08 — End: 2024-04-09

## 2024-04-08 MED ORDER — ASPIRIN 300 MG RE SUPP: 300.0000 mg | Freq: Every day | RECTAL | Status: DC

## 2024-04-08 MED ORDER — ACETAMINOPHEN 325 MG PO TABS
650.0000 mg | ORAL_TABLET | ORAL | Status: DC | PRN
Start: 2024-04-08 — End: 2024-04-09

## 2024-04-08 MED ORDER — SENNOSIDES-DOCUSATE SODIUM 8.6-50 MG PO TABS
1.0000 | ORAL_TABLET | Freq: Every evening | ORAL | Status: DC | PRN
  Administered 2024-04-09: 1 via ORAL
  Filled 2024-04-08: qty 1

## 2024-04-08 MED ORDER — STROKE: EARLY STAGES OF RECOVERY BOOK: Freq: Once | Status: DC

## 2024-04-08 MED ORDER — SODIUM CHLORIDE 0.9 % IV SOLN: INTRAVENOUS | Status: DC

## 2024-04-08 MED ORDER — ASPIRIN 325 MG PO TABS
325.0000 mg | ORAL_TABLET | Freq: Every day | ORAL | Status: DC
Start: 2024-04-08 — End: 2024-04-08
  Administered 2024-04-08: 325 mg via ORAL
  Filled 2024-04-08: qty 1

## 2024-04-08 MED ORDER — ACETAMINOPHEN 650 MG RE SUPP
650.0000 mg | RECTAL | Status: DC | PRN
Start: 2024-04-08 — End: 2024-04-09

## 2024-04-08 MED ORDER — ATORVASTATIN CALCIUM 20 MG PO TABS
40.0000 mg | ORAL_TABLET | Freq: Every day | ORAL | Status: DC
  Administered 2024-04-08: 40 mg via ORAL
  Filled 2024-04-08: qty 2

## 2024-04-08 MED ORDER — ENOXAPARIN SODIUM 80 MG/0.8ML IJ SOSY
0.5000 mg/kg | PREFILLED_SYRINGE | INTRAMUSCULAR | Status: DC
  Administered 2024-04-09: 62.5 mg via SUBCUTANEOUS
  Filled 2024-04-08: qty 0.63

## 2024-04-08 NOTE — ED Triage Notes (Signed)
 Son states patient was sitting at kitchen table where she started staring off into space and then started twitching lasting less than a minute. Patient states she currently feels like she is in a mental fog.

## 2024-04-08 NOTE — Consult Note (Signed)
 NEUROLOGY CONSULT NOTE   Date of service: April 08, 2024 Patient Name: Jennifer Mcbride MRN:  [id number] DOB:  [birth year] Chief Complaint: Episodes of decreased responsiveness Requesting Provider: Von Bellis, MD  History of Present Illness  Jennifer Mcbride is a 68 y.o. female with hx of SVT, iron deficiency anemia who presents with at least two episodes highly concerning for seizure as well as a history of a single episode of loss of consciousness presumed SVT.  The patient describes that she was talking to her son, and then suddenly he said that he was calling the ambulance squad.  She has no memory of the events that led to him wanting to call the ambulance.  He states that he was talking to her when she suddenly stopped talking, she then brought her right arm up towards her face and then it began shaking.  The shaking lasted less than a minute, and while she was shaking, she was unable to respond despite him attempting to get her to do so.  Following this, she seems slightly confused, would not talk to him initially after she started looking at him.  She then continued to improve and return to baseline.  She had a single previous event that was similar from her perspective, though I do not have a witness testimony.  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   She does not think she actually passed out for fell with either of the two events.  She does clearly have an amnestic period with each of them though.  She has a single episode where she lost consciousness.  She was going into work and then suddenly remembers waking up on the ground.  There were no witnesses to this event.  She had a cardiac monitor and workup of this event, and had multiple episodes of SVT.  She also has had an episode where she woke up with a tongue bite which she did not remember doing.  This was several months ago.  Past History   Past  Medical History:  Diagnosis Date   Hypertension    Iron deficiency anemia    IDA   Moderate mitral regurgitation    a. 12/2023 Echo: EF 60-65%, no rwma, nl RV size/fxn, mod MR, mild late systolic prolapse of both leaflets of MV.   Obesity    OSA (obstructive sleep apnea)    PSVT (paroxysmal supraventricular tachycardia) (HCC)    a. 08/2023 [facility]: Predominantly sinus rhythm, average 66 (41-167). 26 runs of SVT, longest 71 beats. Rare PACs/PVCs. No sustained arrhythmias or pauses.   Syncope    a. 08/2023 [facility]: No sustained arrhythmias or pauses.   Urine incontinence    Vitamin D  deficiency     Past Surgical History:  Procedure Laterality Date   COLONOSCOPY WITH PROPOFOL      COLONOSCOPY WITH PROPOFOL  N/A 03/28/2020   Procedure: COLONOSCOPY WITH PROPOFOL ;  Surgeon: Maryruth Ole DASEN, MD;  Location: [facility] ENDOSCOPY;  Service: Endoscopy;  Laterality: N/A;   TUBAL LIGATION      Family History: Family History  Problem Relation Age of Onset   Hypertension Mother    Hearing loss Mother    Depression Mother    Arthritis Mother    Kidney disease Father    Hearing loss Father    Diabetes Father    Mental illness Sister    Early death Sister    Mental illness Sister    Kidney  disease Sister    Early death Sister    Diabetes Sister    Mental illness Sister    Mental illness Brother    Arthritis Brother    Kidney disease Brother    Diabetes Brother    Kidney disease Brother    Diabetes Brother    Heart disease Son    Diabetes Son    Breast cancer Neg Hx     Social History  reports that she has never smoked. She has never used smokeless tobacco. She reports that she does not drink alcohol and does not use drugs.  No Known Allergies  Medications   Current Facility-Administered Medications:    [START ON 04/09/2024]  stroke: early stages of recovery book, , Does not apply, Once, Von Bellis, MD   0.9 %  sodium chloride  infusion, , Intravenous, Continuous, Von Bellis, MD, Last  Rate: 75 mL/hr at 04-19-2024 1608, New Bag at 19-Apr-2024 1608   acetaminophen  (TYLENOL ) tablet 650 mg, 650 mg, Oral, Q4H PRN **OR** acetaminophen  (TYLENOL ) 160 MG/5ML solution 650 mg, 650 mg, Per Tube, Q4H PRN **OR** acetaminophen  (TYLENOL ) suppository 650 mg, 650 mg, Rectal, Q4H PRN, Von Bellis, MD   atorvastatin  (LIPITOR) tablet 40 mg, 40 mg, Oral, QHS, Von Bellis, MD   [START ON 04/09/2024] enoxaparin  (LOVENOX ) injection 62.5 mg, 0.5 mg/kg, Subcutaneous, Q24H, Von, Dileep, MD   senna-docusate (Senokot-S) tablet 1 tablet, 1 tablet, Oral, QHS PRN, Von Bellis, MD  Vitals   Vitals:   04-19-24 1245 04-19-2024 1248  BP:  123/77  Pulse:  63  Resp:  18  Temp:  97.8 F (36.6 C)  TempSrc:  Oral  SpO2:  98%  Weight: 125.6 kg   Height: 5' 1 (1.549 m)     Body mass index is 52.34 kg/m.   Physical Exam   Constitutional: Appears well-developed and well-nourished.  Neurologic Examination    Neuro: Mental Status: Patient is awake, alert, oriented to person, place, month, year, and situation. Patient is able to give a clear and coherent history. No signs of aphasia or neglect Cranial Nerves: II: Visual Fields are full. Pupils are equal, round, and reactive to light.   III,IV, VI: EOMI without ptosis or diploplia.  V: Facial sensation is symmetric to temperature VII: Facial movement is symmetric.  VIII: hearing is intact to voice X: Uvula elevates symmetrically XII: tongue is midline without atrophy or fasciculations.  Motor: Tone is normal. Bulk is normal. 5/5 strength was present in all four extremities.  Sensory: Sensation is symmetric to light touch and temperature in the arms and legs. Cerebellar: FNF and HKS are intact bilaterally   Labs/Imaging/Neurodiagnostic studies   CBC:  Recent Labs  Lab 2024-04-19 1248  WBC 6.7  HGB 12.0  HCT 36.8  MCV 91.3  PLT 267   Basic Metabolic Panel:  Lab Results  Component Value Date   NA 141 04-19-2024   K 3.7 19-Apr-2024    CO2 21 (L) 2024-04-19   GLUCOSE 103 (H) 04/19/24   BUN 12 04/19/2024   CREATININE 0.78 April 19, 2024   CALCIUM  9.7 2024-04-19   GFRNONAA >60 2024-04-19   Lipid Panel:  Lab Results  Component Value Date   LDLCALC 109 (H) 03/31/2024   HgbA1c:  Lab Results  Component Value Date   HGBA1C 5.2 03/31/2024    CT Head without contrast(Personally reviewed): negative    ASSESSMENT   Jennifer Mcbride is a 68 y.o. female with a history of at least two episodes highly concerning for partial seizures,  as well as some other less certain episodes.  Given the description, and the fact that she has had at least two, I think that antiepileptic therapy would be warranted.  She will need further workup including MRI and EEG and following EEG I would favor starting her on antiepileptic therapy.  RECOMMENDATIONS  MRI brain with and without contrast EEG Check magnesium Following EEG, start Keppra  500 mg twice daily ______________________________________________________________________    Signed, Aisha Seals, MD [facility]

## 2024-04-08 NOTE — ED Triage Notes (Signed)
 Arrived by [facility] from home. C/o syncopal episode while sitting at table. Ongoing confusion X10 years per family.   EMS vitals: 97 CBG 98% RA 80HR 145/101 b/p

## 2024-04-08 NOTE — ED Provider Notes (Signed)
 [facility] Provider Note    Event Date/Time   First MD Initiated Contact with Patient 04/08/24 1349     (approximate)   History   No chief complaint on file.   HPI  Jennifer Mcbride is a 68 y.o. female  female with a history of hypertension, presumed-syncope, obesity, sleep apnea on CPAP, and iron deficiency anemia, who presents to the emergency department after an episode of unresponsiveness of unclear origin.  Patient was in her usual state of health and was sitting at a table when in front of her son who is at bedside currently she be came staring out into space.  She had some tremors in the whole right side of her face twisted there were no general tonic-clonic movements but patient appeared to try to talk and was unable to get words out.  Patient does not recall any preceding lightheadedness or chest pain.  She is currently asymptomatic.  She reports that she is not taking any medications currently.  She had 1 previous episode of possible syncope earlier this year and has followed up with cardiology and the workup was largely unremarkable. Echo completed 12/10/2023: LVEF 60-65      Physical Exam   Triage Vital Signs: ED Triage Vitals  Encounter Vitals Group     BP 04/08/24 1248 123/77     Girls Systolic BP Percentile --      Girls Diastolic BP Percentile --      Boys Systolic BP Percentile --      Boys Diastolic BP Percentile --      Pulse Rate 04/08/24 1248 63     Resp 04/08/24 1248 18     Temp 04/08/24 1248 97.8 F (36.6 C)     Temp Source 04/08/24 1248 Oral     SpO2 04/08/24 1248 98 %     Weight 04/08/24 1245 277 lb (125.6 kg)     Height 04/08/24 1245 5' 1 (1.549 m)     Head Circumference --      Peak Flow --      Pain Score 04/08/24 1244 0     Pain Loc --      Pain Education --      Exclude from Growth Chart --     Most recent vital signs: Vitals:   04/08/24 1248  BP: 123/77  Pulse: 63  Resp: 18  Temp: 97.8 F (36.6 C)   SpO2: 98%    Nursing Triage Note reviewed. Vital signs reviewed and patients oxygen saturation is normoxic  General: Patient is well nourished, well developed, awake and alert, resting comfortably in no acute distress Head: Normocephalic and atraumatic Eyes: Normal inspection, extraocular muscles intact, no conjunctival pallor Ear, nose, throat: Normal external exam Neck: Normal range of motion Respiratory: Patient is in no respiratory distress, lungs CTAB Cardiovascular: Patient is not tachycardic, RRR without murmur appreciated GI: Abd SNT with no guarding or rebound  Back: Normal inspection of the back with good strength and range of motion throughout all ext Extremities: pulses intact with good cap refills, no LE pitting edema or calf tenderness Neuro: The patient is alert and oriented to person, place, and time, appropriately conversive, with 5/5 bilat UE/LE strength, no gross motor or sensory defects noted. Coordination appears to be adequate.  No dysmetria to finger-to-nose Skin: Warm, dry, and intact Psych: normal mood and affect, no SI or HI  ED Results / Procedures / Treatments   Labs (all labs ordered are listed, but  only abnormal results are displayed) Labs Reviewed  COMPREHENSIVE METABOLIC PANEL WITH GFR - Abnormal; Notable for the following components:      Result Value   CO2 21 (*)    Glucose, Bld 103 (*)    All other components within normal limits  URINALYSIS, ROUTINE W REFLEX MICROSCOPIC - Abnormal; Notable for the following components:   Color, Urine YELLOW (*)    APPearance HAZY (*)    All other components within normal limits  CBC  CK  HIV ANTIBODY (ROUTINE TESTING W REFLEX)  CBG MONITORING, ED  TROPONIN I (HIGH SENSITIVITY)     EKG EKG and rhythm strip are interpreted by myself:   EKG: [Normal sinus rhythm] at heart rate of 64, normal QRS duration, QTc 402, nonspecific ST segments and T waves no ectopy EKG not consistent with Acute STEMI Rhythm  strip: NSR in lead II   RADIOLOGY CT head without contrast: Unremarkable    PROCEDURES:  Critical Care performed: No  Procedures   MEDICATIONS ORDERED IN ED: Medications   stroke: early stages of recovery book (has no administration in time range)  0.9 %  sodium chloride  infusion (has no administration in time range)  acetaminophen  (TYLENOL ) tablet 650 mg (has no administration in time range)    Or  acetaminophen  (TYLENOL ) 160 MG/5ML solution 650 mg (has no administration in time range)    Or  acetaminophen  (TYLENOL ) suppository 650 mg (has no administration in time range)  senna-docusate (Senokot-S) tablet 1 tablet (has no administration in time range)  enoxaparin  (LOVENOX ) injection 62.5 mg (has no administration in time range)  aspirin  suppository 300 mg (has no administration in time range)    Or  aspirin  tablet 325 mg (has no administration in time range)  atorvastatin  (LIPITOR) tablet 40 mg (has no administration in time range)     IMPRESSION / MDM / ASSESSMENT AND PLAN / ED COURSE     ABCD2 Score: 3                          Differential diagnosis includes, but is not limited to, TIA, seizure, syncope, electrolyte derangement anemia  ED course: Patient is well-appearing, asymptomatic and without any focal neurological deficits.  It is unclear to me given the reported history whether this was a TIA or a seizure event as it does not sound as if she lost consciousness.  It seems as if she had a syncopal workup in the past/earlier this year which was largely negative.  EKG here was without arrhythmia and she denies any chest pain.  She has no electrolyte derangements.  CT head is unremarkable.  Given the fact that she is not on any medications and her multiple comorbidities combined with this event we will discuss possible admission with the hospitalist today   Clinical Course as of 04/08/24 1527  Wed Apr 08, 2024  1445 CT Head Wo Contrast No acute abnormality [HD]   1445 Troponin I (High Sensitivity): 3 Not elevated [HD]  1526 Case discussed with hospitalist and appropriate for admission [HD]    Clinical Course User Index [HD] Nicholaus Rolland BRAVO, MD     FINAL CLINICAL IMPRESSION(S) / ED DIAGNOSES   Final diagnoses:  Transient ischemic attack     Rx / DC Orders   ED Discharge Orders     None        Note:  This document was prepared using Dragon voice recognition software and may include unintentional  dictation errors.   Nicholaus Rolland BRAVO, MD 04/08/24 [phone]

## 2024-04-08 NOTE — Progress Notes (Signed)
 PHARMACIST - PHYSICIAN COMMUNICATION  CONCERNING:  Enoxaparin  (Lovenox ) for DVT Prophylaxis    RECOMMENDATION: Patient was prescribed enoxaprin 40mg  q24 hours for VTE prophylaxis.   Filed Weights   04/08/24 1245  Weight: 125.6 kg (277 lb)    Body mass index is 52.34 kg/m.  Estimated Creatinine Clearance: 86.2 mL/min (by C-G formula based on SCr of 0.78 mg/dL).   Based on [facility] policy patient is candidate for enoxaparin  0.5mg /kg TBW SQ every 24 hours based on BMI being >30.  DESCRIPTION: Pharmacy has adjusted enoxaparin  dose per [facility] policy.  Patient is now receiving enoxaparin  62.5 mg every 24 hours    Damien Napoleon, PharmD Clinical Pharmacist  04/08/2024 3:15 PM

## 2024-04-08 NOTE — H&P (Signed)
 [facility] History and Physical   Patient: Jennifer Mcbride FMW:[id number]   PCP: Gareth Mliss FALCON, FNP DOB: [birth year]   DOA: 04/08/2024   DOS: 04/08/2024   DOS: the patient was seen and examined on 04/08/2024  Patient coming from: The patient is coming from Home  Chief Complaint: starring and twitching   HPI: Danniela Mcbrearty is a 68 y.o. female with Past medical history of HTN, HLD, mitral valve prolapse, OSA, obesity on CPAP has recovered remarkable presented with an episode of starring and twitching. As per pt's son she was sitting on the dinning table and talking, suddenly she mentally left the conversation and kept one hand on the mouth and soon started twitching for < 1 min, noticed right side facial drift which resolved. Pt was not aware of this episode, no provoking symptoms and after the episode she fel foggy mind. No any focal deficits.  Currently she is back to her baseline.  Denies any headache or dizziness.  No oriented complaints.   ED Course: VS afebrile, HR 63, RR 18, BP 123/77, 98% on RA BMP: CO2 21, glucose 103 rest within normal range CK 72.  Troponin negative CBC wnl UA negative CT Head: No acute intracranial abnormality.    Review of Systems: as mentioned in the history of present illness.  All other systems reviewed and are negative.  Past Medical History:  Diagnosis Date   Hypertension    Iron deficiency anemia    IDA   Moderate mitral regurgitation    a. 12/2023 Echo: EF 60-65%, no rwma, nl RV size/fxn, mod MR, mild late systolic prolapse of both leaflets of MV.   Obesity    OSA (obstructive sleep apnea)    PSVT (paroxysmal supraventricular tachycardia) (HCC)    a. 08/2023 Zio: Predominantly sinus rhythm, average 66 (41-167). 26 runs of SVT, longest 71 beats. Rare PACs/PVCs. No sustained arrhythmias or pauses.   Syncope    a. 08/2023 Zio: No sustained arrhythmias or pauses.   Urine incontinence    Vitamin D  deficiency    Past Surgical History:   Procedure Laterality Date   COLONOSCOPY WITH PROPOFOL      COLONOSCOPY WITH PROPOFOL  N/A 03/28/2020   Procedure: COLONOSCOPY WITH PROPOFOL ;  Surgeon: Maryruth Ole DASEN, MD;  Location: [facility] ENDOSCOPY;  Service: Endoscopy;  Laterality: N/A;   TUBAL LIGATION     Social History:  reports that she has never smoked. She has never used smokeless tobacco. She reports that she does not drink alcohol and does not use drugs.  No Known Allergies   Family history reviewed and not pertinent Family History  Problem Relation Age of Onset   Hypertension Mother    Hearing loss Mother    Depression Mother    Arthritis Mother    Kidney disease Father    Hearing loss Father    Diabetes Father    Mental illness Sister    Early death Sister    Mental illness Sister    Kidney disease Sister    Early death Sister    Diabetes Sister    Mental illness Sister    Mental illness Brother    Arthritis Brother    Kidney disease Brother    Diabetes Brother    Kidney disease Brother    Diabetes Brother    Heart disease Son    Diabetes Son    Breast cancer Neg Hx      Prior to Admission medications   Medication Sig Start Date End Date  Taking? Authorizing Provider  Multiple Vitamin (MULTIVITAMIN) tablet Take 1 tablet by mouth daily.   Yes [provider]    Physical Exam: Vitals:   04/08/24 1245 04/08/24 1248  BP:  123/77  Pulse:  63  Resp:  18  Temp:  97.8 F (36.6 C)  TempSrc:  Oral  SpO2:  98%  Weight: 125.6 kg   Height: 5' 1 (1.549 m)     General: alert and oriented to time, place, and person. Appear in mild distress, affect appropriate Eyes: PERRLA, Conjunctiva normal ENT: Oral Mucosa Clear, moist  Neck: no JVD, no Abnormal Mass Or lumps Cardiovascular: S1 and S2 Present, no Murmur, peripheral pulses symmetrical Respiratory: good respiratory effort, Bilateral Air entry equal and Decreased, no signs of accessory muscle use, Clear to Auscultation, no Crackles, no  wheezes Abdomen: Bowel Sound present, Soft and no tenderness, no hernia Skin: no rashes  Extremities: no Pedal edema, no calf tenderness Neurologic: without any new focal findings Gait not checked due to patient safety concerns  Data Reviewed: I have personally reviewed and interpreted labs, imaging as discussed below.  CBC: Recent Labs  Lab 04/08/24 1248  WBC 6.7  HGB 12.0  HCT 36.8  MCV 91.3  PLT 267   Basic Metabolic Panel: Recent Labs  Lab 04/08/24 1248  NA 141  K 3.7  CL 109  CO2 21*  GLUCOSE 103*  BUN 12  CREATININE 0.78  CALCIUM  9.7   GFR: Estimated Creatinine Clearance: 86.2 mL/min (by C-G formula based on SCr of 0.78 mg/dL). Liver Function Tests: Recent Labs  Lab 04/08/24 1248  AST 17  ALT 15  ALKPHOS 72  BILITOT 1.0  PROT 7.9  ALBUMIN 4.0   No results for input(s): LIPASE, AMYLASE in the last 168 hours. No results for input(s): AMMONIA in the last 168 hours. Coagulation Profile: No results for input(s): INR, PROTIME in the last 168 hours. Cardiac Enzymes: Recent Labs  Lab 04/08/24 1248  CKTOTAL 72   BNP (last 3 results) No results for input(s): PROBNP in the last 8760 hours. HbA1C: No results for input(s): HGBA1C in the last 72 hours. CBG: Recent Labs  Lab 04/08/24 1429  GLUCAP 83   Lipid Profile: No results for input(s): CHOL, HDL, LDLCALC, TRIG, CHOLHDL, LDLDIRECT in the last 72 hours. Thyroid  Function Tests: No results for input(s): TSH, T4TOTAL, FREET4, T3FREE, THYROIDAB in the last 72 hours. Anemia Panel: No results for input(s): VITAMINB12, FOLATE, FERRITIN, TIBC, IRON, RETICCTPCT in the last 72 hours. Urine analysis:    Component Value Date/Time   COLORURINE YELLOW (A) 04/08/2024 1434   APPEARANCEUR HAZY (A) 04/08/2024 1434   APPEARANCEUR Clear 01/17/2021 0936   LABSPEC 1.011 04/08/2024 1434   PHURINE 5.0 04/08/2024 1434   GLUCOSEU NEGATIVE 04/08/2024 1434   HGBUR NEGATIVE  04/08/2024 1434   BILIRUBINUR NEGATIVE 04/08/2024 1434   BILIRUBINUR Negative 01/17/2021 0936   KETONESUR NEGATIVE 04/08/2024 1434   PROTEINUR NEGATIVE 04/08/2024 1434   NITRITE NEGATIVE 04/08/2024 1434   LEUKOCYTESUR NEGATIVE 04/08/2024 1434    Radiological Exams on Admission: CT Head Wo Contrast Result Date: 04/08/2024 CLINICAL DATA:  TIA EXAM: CT HEAD WITHOUT CONTRAST TECHNIQUE: Contiguous axial images were obtained from the base of the skull through the vertex without intravenous contrast. RADIATION DOSE REDUCTION: This exam was performed according to the departmental dose-optimization program which includes automated exposure control, adjustment of the mA and/or kV according to patient size and/or use of iterative reconstruction technique. COMPARISON:  None Available. FINDINGS: Brain: No  acute intracranial abnormality. Specifically, no hemorrhage, hydrocephalus, mass lesion, acute infarction, or significant intracranial injury. Vascular: No hyperdense vessel or unexpected calcification. Skull: No acute calvarial abnormality. Sinuses/Orbits: No acute findings Other: None IMPRESSION: No acute intracranial abnormality. Electronically Signed   By: Franky Crease M.D.   On: 04/08/2024 14:42   EKG: Independently reviewed. SR, sinus arrhythmia.  No any significant acute findings  Echocardiogram: pending  I reviewed all nursing notes, pharmacy notes, vitals, pertinent old records.  Assessment/Plan Principal Problem:   Stroke-like symptoms   # Possible seizures Continue seizure precautions Follow-up MRI brain Follow EEG Follow neurology  HTN, HLD She is not on any medications at home Monitor BP LDL 109 slightly high, started Lipitor 40 mg pod  OSA, on cpap  Nutrition: Regular diet DVT Prophylaxis: Subcutaneous Lovenox   Advance goals of care discussion: Full code   Consults: I personally Discussed with Dr. Michaela, Neurologist   Family Communication: family was present at  bedside, at the time of interview.  Opportunity was given to ask question and all questions were answered satisfactorily.  Disposition: Admitted as observation, telemetry unit. Likely to be discharged home, in 1-2 days, when cleared by Neurology.  I have discussed plan of care as described above with RN and patient/family.  Severity of Illness: The appropriate patient status for this patient is OBSERVATION. Observation status is judged to be reasonable and necessary in order to provide the required intensity of service to ensure the patient's safety. The patient's presenting symptoms, physical exam findings, and initial radiographic and laboratory data in the context of their medical condition is felt to place them at decreased risk for further clinical deterioration. Furthermore, it is anticipated that the patient will be medically stable for discharge from the hospital within 2 midnights of admission.    Author: ELVAN SOR, MD [facility] 04/08/2024 4:38 PM   To reach On-call, see care teams to locate the attending and reach out to them via [email or web address]. If 7PM-7AM, please contact night-coverage If you still have difficulty reaching the attending provider, please page the [facility] (Director on Call) for [facility] on amion for assistance.

## 2024-04-09 ENCOUNTER — Observation Stay

## 2024-04-09 DIAGNOSIS — G40209 Localization-related (focal) (partial) symptomatic epilepsy and epileptic syndromes with complex partial seizures, not intractable, without status epilepticus: Secondary | ICD-10-CM | POA: Diagnosis not present

## 2024-04-09 DIAGNOSIS — G4089 Other seizures: Secondary | ICD-10-CM | POA: Diagnosis not present

## 2024-04-09 DIAGNOSIS — G40909 Epilepsy, unspecified, not intractable, without status epilepticus: Secondary | ICD-10-CM

## 2024-04-09 DIAGNOSIS — R569 Unspecified convulsions: Secondary | ICD-10-CM

## 2024-04-09 LAB — BASIC METABOLIC PANEL WITH GFR
Anion gap: 8 (ref 5–15)
BUN: 13 mg/dL (ref 8–23)
CO2: 25 mmol/L (ref 22–32)
Calcium: 9.2 mg/dL (ref 8.9–10.3)
Chloride: 107 mmol/L (ref 98–111)
Creatinine, Ser: 0.71 mg/dL (ref 0.44–1.00)
GFR, Estimated: >60 mL/min (ref >60)
Glucose, Bld: 87 mg/dL (ref 70–99)
Potassium: 4.3 mmol/L (ref 3.5–5.1)
Sodium: 140 mmol/L (ref 135–145)

## 2024-04-09 LAB — CBC
HCT: 35.2 % — ABNORMAL LOW (ref 36.0–46.0)
Hemoglobin: 11.6 g/dL — ABNORMAL LOW (ref 12.0–15.0)
MCH: 30.1 pg (ref 26.0–34.0)
MCHC: 33 g/dL (ref 30.0–36.0)
MCV: 91.2 fL (ref 80.0–100.0)
Platelets: 247 K/uL (ref 150–400)
RBC: 3.86 MIL/uL — ABNORMAL LOW (ref 3.87–5.11)
RDW: 12.8 % (ref 11.5–15.5)
WBC: 5.8 K/uL (ref 4.0–10.5)
nRBC: 0 % (ref 0.0–0.2)

## 2024-04-09 LAB — VITAMIN B12: Vitamin B-12: 444 pg/mL (ref 180–914)

## 2024-04-09 LAB — MAGNESIUM: Magnesium: 2 mg/dL (ref 1.7–2.4)

## 2024-04-09 LAB — HIV ANTIBODY (ROUTINE TESTING W REFLEX): HIV Screen 4th Generation wRfx: NONREACTIVE

## 2024-04-09 LAB — VITAMIN D 25 HYDROXY (VIT D DEFICIENCY, FRACTURES): Vit D, 25-Hydroxy: 36.52 ng/mL (ref 30–100)

## 2024-04-09 LAB — PHOSPHORUS: Phosphorus: 3.2 mg/dL (ref 2.5–4.6)

## 2024-04-09 MED ORDER — LEVETIRACETAM 500 MG PO TABS: 500.0000 mg | ORAL_TABLET | Freq: Two times a day (BID) | ORAL | 3 refills | Status: DC

## 2024-04-09 MED ORDER — GADOBUTROL 1 MMOL/ML IV SOLN
10.0000 mL | Freq: Once | INTRAVENOUS | Status: AC | PRN
Start: 2024-04-09 — End: 2024-04-09
  Administered 2024-04-09: 10 mL via INTRAVENOUS

## 2024-04-09 MED ORDER — ATORVASTATIN CALCIUM 40 MG PO TABS: 20.0000 mg | ORAL_TABLET | Freq: Every day | ORAL | 5 refills | Status: DC

## 2024-04-09 MED ORDER — LEVETIRACETAM 500 MG PO TABS
500.0000 mg | ORAL_TABLET | Freq: Two times a day (BID) | ORAL | Status: DC
  Administered 2024-04-09: 500 mg via ORAL
  Filled 2024-04-09: qty 1

## 2024-04-09 NOTE — Progress Notes (Signed)
 NEUROLOGY CONSULT FOLLOW UP NOTE   Date of service: April 09, 2024 Patient Name: Jennifer Mcbride MRN:  [id number] DOB:  March 16, 1956  Interval Hx/subjective   No further events  Vitals   Vitals:   04/08/24 2335 04/09/24 0243 04/09/24 0340 04/09/24 0818  BP: (!) 114/56 (!) 114/55 (!) 108/58 123/61  Pulse:  80 (!) 57 (!) 55  Resp: 16 17 18 17   Temp: 97.9 F (36.6 C) 97.9 F (36.6 C) 97.7 F (36.5 C) 97.9 F (36.6 C)  TempSrc:  Oral Oral   SpO2: 96% 98% 100% 100%  Weight:      Height:         Body mass index is 52.34 kg/m.  Physical Exam   Constitutional: Appears well-developed and well-nourished.  Neurologic Examination    MS: In bed, NAD, interactive and appropriate CN: Visual fields full, EOMI Motor: Moves all extremities well Sensory: Intact to light touch  Medications  Current Facility-Administered Medications:     stroke: early stages of recovery book, , Does not apply, Once, Von Bellis, MD   0.9 %  sodium chloride  infusion, , Intravenous, Continuous, Von Bellis, MD, Last Rate: 75 mL/hr at 04/09/24 0340, Restarted at 04/09/24 0340   acetaminophen  (TYLENOL ) tablet 650 mg, 650 mg, Oral, Q4H PRN **OR** acetaminophen  (TYLENOL ) 160 MG/5ML solution 650 mg, 650 mg, Per Tube, Q4H PRN **OR** acetaminophen  (TYLENOL ) suppository 650 mg, 650 mg, Rectal, Q4H PRN, Von Bellis, MD   atorvastatin  (LIPITOR) tablet 40 mg, 40 mg, Oral, QHS, Von Bellis, MD, 40 mg at 04/08/24 2123   enoxaparin  (LOVENOX ) injection 62.5 mg, 0.5 mg/kg, Subcutaneous, Q24H, Von Bellis, MD, 62.5 mg at 04/09/24 0913   senna-docusate (Senokot-S) tablet 1 tablet, 1 tablet, Oral, QHS PRN, Von Bellis, MD, 1 tablet at 04/09/24 0355  Labs and Diagnostic Imaging   Magnesium 2.0  Imaging(Personally reviewed): MRI brain is negative  Assessment   Jennifer Mcbride is a 68 y.o. female with a history of at least two episodes highly concerning for complex partial seizure, possibly with as  many as four episodes of concern.  Given the description, I think she is at a very high risk of recurrent seizure if she were to not be on medication and therefore I would recommend antiepileptic therapy.  There is currently no clear etiology, but the fact that the seizures were focal, there have been multiple, and have not been clearly provoked all contribute to increasing her risk of recurrent events without treatment.  Recommendations  EEG Following EEG would start Keppra  500 mg twice daily Patient is unable to drive, operate heavy machinery, perform activities at heights or participate in water activities until release by outpatient physician. This was discussed with the patient who expressed understanding.   ______________________________________________________________________   Signed, Aisha Seals, MD [facility]

## 2024-04-09 NOTE — Progress Notes (Signed)
 Eeg done

## 2024-04-09 NOTE — Discharge Summary (Signed)
 [facility] Discharge Summary   Patient: Jennifer Mcbride FMW:[id number]  PCP: Gareth Mliss FALCON, FNP  Date of admission: 04/08/2024   Date of discharge:  04/09/2024     Discharge Diagnoses:  Principal Problem:   Seizures, transient (HCC) Active Problems:   Stroke-like symptoms   Admitted From: Home Disposition:  Home   Recommendations for Outpatient Follow-up:  Follow-up with PCP in 1 week, repeat lipid profile after 6 months follow-up with neurology in 1 week.  No driving for 6 months/until cleared by neurology Follow up LABS/TEST:     Diet recommendation: Cardiac diet  Activity: The patient is advised to gradually reintroduce usual activities, as tolerated  Discharge Condition: stable  Code Status: Full code   History of present illness: As per the H and P dictated on admission.  Hospital Course:  Jennifer Mcbride is a 68 y.o. female with Past medical history of HTN, HLD, mitral valve prolapse, OSA, obesity on CPAP has recovered remarkable presented with an episode of starring and twitching. As per pt's son she was sitting on the dinning table and talking, suddenly she mentally left the conversation and kept one hand on the mouth and soon started twitching for < 1 min, noticed right side facial drift which resolved. Pt was not aware of this episode, no provoking symptoms and after the episode she fel foggy mind. No any focal deficits.  Currently she is back to her baseline.  Denies any headache or dizziness.  No oriented complaints.   ED Course: VS afebrile, HR 63, RR 18, BP 123/77, 98% on RA BMP: CO2 21, glucose 103 rest within normal range CK 72.  Troponin negative CBC wnl UA negative CT Head: No acute intracranial abnormality.     Assessment/Plan   # Seizure disorder, new onset Continue seizure precautions. No episodes during hospital stay MRI brain:  No acute intracranial abnormality. No mass or abnormal enhancement. Small polypoidal mucosal density within  the left maxillary sinus. EEG: This EEG is consistent with an area of potential pileptogenicity in the left temporal region. There was no seizure recorded on this study.  Consulted Neurology, recommended Keppra  500 mg po BID and f/u as an out patient. No driving for 6 months and until cleared by Neuro   # HTN, HLD She is not on any medications at home LDL 109 slightly high, started Lipitor 20 mg pod BP remained stable    # OSA, on cpap  Body mass index is 52.34 kg/m.  Nutrition Interventions:  Patient is unable to drive, operate heavy machinery, perform activities at heights or participate in water activities until release by outpatient physician. This was discussed with the patient who expressed understanding.    - Patient was instructed, not to drive, operate heavy machinery, perform activities at heights, swimming or participation in water activities or provide baby sitting services while on Pain, Sleep and Anxiety Medications; until her outpatient Physician has advised to do so again.  - Also recommended to not to take more than prescribed Pain, Sleep and Anxiety Medications.  Patient was ambulatory without any assistance. On the day of the discharge the patient's vitals were stable, and no other acute medical condition were reported by patient. the patient was felt safe to be discharge at Home.  Consultants: Neurology  Procedures: EEG: consistent with an area of potential pileptogenicity in the left temporal region.  Discharge Exam: General: Appear in no distress, Oral Mucosa Clear, moist. Cardiovascular: S1 and S2 Present, no Murmur, Respiratory: normal respiratory  effort, Bilateral Air entry present and no Crackles, no wheezes Abdomen: Bowel Sound present, Soft and no tenderness. Extremities: no Pedal edema, no calf tenderness Neurology: alert and oriented to time, place, and person affect appropriate.  Filed Weights   04/08/24 1245  Weight: 125.6 kg   Vitals:    04/09/24 0340 04/09/24 0818  BP: (!) 108/58 123/61  Pulse: (!) 57 (!) 55  Resp: 18 17  Temp: 97.7 F (36.5 C) 97.9 F (36.6 C)  SpO2: 100% 100%    DISCHARGE MEDICATION: Allergies as of 04/09/2024   No Known Allergies      Medication List     TAKE these medications    atorvastatin  40 MG tablet Commonly known as: LIPITOR Take 0.5 tablets (20 mg total) by mouth at bedtime.   levETIRAcetam  500 MG tablet Commonly known as: KEPPRA  Take 1 tablet (500 mg total) by mouth 2 (two) times daily.   multivitamin tablet Take 1 tablet by mouth daily.       No Known Allergies Discharge Instructions     Ambulatory referral to Neurology   Complete by: As directed    An appointment is requested in approximately: 1 week   Call MD for:   Complete by: As directed    Recurrent similar episodes or seizures   Call MD for:  difficulty breathing, headache or visual disturbances   Complete by: As directed    Call MD for:  extreme fatigue   Complete by: As directed    Call MD for:  persistant dizziness or light-headedness   Complete by: As directed    Call MD for:  persistant nausea and vomiting   Complete by: As directed    Call MD for:  severe uncontrolled pain   Complete by: As directed    Diet general   Complete by: As directed    Discharge instructions   Complete by: As directed    Follow-up with PCP in 1 week, repeat lipid profile after 6 months follow-up with neurology in 1 week.  No driving for 6 months/until cleared by neurology   Increase activity slowly   Complete by: As directed        The results of significant diagnostics from this hospitalization (including imaging, microbiology, ancillary and laboratory) are listed below for reference.    Significant Diagnostic Studies: MR BRAIN W WO CONTRAST Result Date: 04/09/2024 EXAM: MRI BRAIN WITH AND WITHOUT CONTRAST 04/09/2024 03:28:58 AM TECHNIQUE: Multiplanar multisequence MRI of the head/brain was performed with and  without the administration of intravenous contrast. COMPARISON: CT of the head dated 04/08/2024. CLINICAL HISTORY: Seizure, new-onset, no history of trauma. FINDINGS: BRAIN AND VENTRICLES: No acute infarct. No acute intracranial hemorrhage. No mass effect or midline shift. No hydrocephalus. The sella is unremarkable. Normal flow voids. No mass or abnormal enhancement. There is no evidence of mesial temporal sclerosis, cortical dysplasia or heterotopia. There are no findings to explain seizure disorder. ORBITS: No acute abnormality. SINUSES: There is a small polypoidal mucosal density within the left maxillary sinus. BONES AND SOFT TISSUES: Normal bone marrow signal and enhancement. No acute soft tissue abnormality. IMPRESSION: 1. No acute intracranial abnormality. 2. No mass or abnormal enhancement. 3. Small polypoidal mucosal density within the left maxillary sinus. Electronically signed by: Evalene Coho MD 04/09/2024 05:46 AM EDT RP Workstation: [id number]   CT Head Wo Contrast Result Date: 04/08/2024 CLINICAL DATA:  TIA EXAM: CT HEAD WITHOUT CONTRAST TECHNIQUE: Contiguous axial images were obtained from the base of the skull through  the vertex without intravenous contrast. RADIATION DOSE REDUCTION: This exam was performed according to the departmental dose-optimization program which includes automated exposure control, adjustment of the mA and/or kV according to patient size and/or use of iterative reconstruction technique. COMPARISON:  None Available. FINDINGS: Brain: No acute intracranial abnormality. Specifically, no hemorrhage, hydrocephalus, mass lesion, acute infarction, or significant intracranial injury. Vascular: No hyperdense vessel or unexpected calcification. Skull: No acute calvarial abnormality. Sinuses/Orbits: No acute findings Other: None IMPRESSION: No acute intracranial abnormality. Electronically Signed   By: Franky Crease M.D.   On: 04/08/2024 14:42    Microbiology: No results found  for this or any previous visit (from the past 240 hours).   Labs: CBC: Recent Labs  Lab 04/08/24 1248 04/09/24 0614  WBC 6.7 5.8  HGB 12.0 11.6*  HCT 36.8 35.2*  MCV 91.3 91.2  PLT 267 247   Basic Metabolic Panel: Recent Labs  Lab 04/08/24 1248 04/09/24 0614  NA 141 140  K 3.7 4.3  CL 109 107  CO2 21* 25  GLUCOSE 103* 87  BUN 12 13  CREATININE 0.78 0.71  CALCIUM  9.7 9.2  MG  --  2.0  PHOS  --  3.2   Liver Function Tests: Recent Labs  Lab 04/08/24 1248  AST 17  ALT 15  ALKPHOS 72  BILITOT 1.0  PROT 7.9  ALBUMIN 4.0   No results for input(s): LIPASE, AMYLASE in the last 168 hours. No results for input(s): AMMONIA in the last 168 hours. Cardiac Enzymes: Recent Labs  Lab 04/08/24 1248  CKTOTAL 72   BNP (last 3 results) No results for input(s): BNP in the last 8760 hours. CBG: Recent Labs  Lab 04/08/24 1429  GLUCAP 83    Time spent: 35 minutes  Signed:  Elvan Sor  [facility] 04/09/2024 12:43 PM

## 2024-04-09 NOTE — Procedures (Signed)
 History: 68 year old female being evaluated for seizures  EEG Duration: 33 minutes  Sedation: None  Patient State: Awake and asleep  Technique: This EEG was acquired with electrodes placed according to the International 10-20 electrode system (including Fp1, Fp2, F3, F4, C3, C4, P3, P4, O1, O2, T3, T4, T5, T6, A1, A2, Fz, Cz, Pz). The following electrodes were missing or displaced: none.   Background: The background consists of intermixed alpha and beta activities. There is a well defined posterior dominant rhythm of 10 hz that attenuates with eye opening. Sleep is recorded with normal appearing structures.  There is a single sharp wave discharge that is maximal at F7, P7 >P3, P7  Photic stimulation: Physiologic driving is now performed  EEG Abnormalities: 1) left temporal sharp wave  Clinical Interpretation: This EEG is consistent with an area of potential epileptogenicity in the left temporal region. There was no seizure recorded on this study. SABRA Aisha Seals, MD [facility]   If 7pm- 7am, please page neurology on call as listed in AMION.

## 2024-04-09 NOTE — Care Management Obs Status (Signed)
 MEDICARE OBSERVATION STATUS NOTIFICATION   Patient Details  Name: Jennifer Mcbride MRN: [id number] Date of Birth: [birth year]   Medicare Observation Status Notification Given:  Yes (patient did not want a copy)    Rojelio SHAUNNA Rattler 04/09/2024, 12:04 PM

## 2024-04-09 NOTE — TOC Initial Note (Signed)
 Transition of Care [facility]) - Initial/Assessment Note    Patient Details  Name: Jennifer Mcbride MRN: [id number] Date of Birth: 05-24-1956  Transition of Care [facility]) CM/SW Contact:    Dalia GORMAN Fuse, RN Phone Number: 04/09/2024, 10:17 AM  Clinical Narrative:                 Patient is from home with stroke like symptoms, neuro following. No TOC needs at this time, please outreach if needs are identified.        Patient Goals and CMS Choice            Expected Discharge Plan and Services                                              Prior Living Arrangements/Services                       Activities of Daily Living   ADL Screening (condition at time of admission) Independently performs ADLs?: Yes (appropriate for developmental age) Is the patient deaf or have difficulty hearing?: No Does the patient have difficulty seeing, even when wearing glasses/contacts?: No Does the patient have difficulty concentrating, remembering, or making decisions?: No  Permission Sought/Granted                  Emotional Assessment              Admission diagnosis:  Transient ischemic attack [G45.9] Stroke-like symptoms [R29.90] Patient Active Problem List   Diagnosis Date Noted   Seizures, transient (HCC) 04/09/2024   Stroke-like symptoms 04/08/2024   Other insomnia 03/18/2023   Other hyperlipidemia 09/14/2022   Family history of colonic polyps 01/17/2021   Uterine leiomyoma 01/17/2021   Need for shingles vaccine 01/17/2021   Right leg swelling 01/17/2021   Urinary incontinence 01/17/2021   Stress incontinence 01/17/2021   Encounter for imaging of bilateral greater saphenous veins 01/17/2021   OSA (obstructive sleep apnea) 01/17/2021   Pure hypercholesterolemia 11/24/2019   COVID-19 virus detected 09/23/2019   Morbid obesity with BMI of 50.0-59.9, adult (HCC) 04/25/2018   Hypertension 04/11/2017   Colon polyp 10/22/2016   Obesity 10/22/2016    Vitamin D  deficiency 10/22/2016   Iron deficiency anemia secondary to inadequate dietary iron intake 10/22/2016   PCP:  Gareth Mliss FALCON, FNP Pharmacy:   [facility] [phone] - [address], [place] - [address] [place] [address] Phone: [phone] Fax: [phone]  [id number] [place], [place] - [address] AT [facility] [address] [place] [address] Phone: [phone] Fax: [phone]     Social Drivers of Health (SDOH) Social History: SDOH Screenings   Food Insecurity: No Food Insecurity (04/08/2024)  Housing: Low Risk  (04/08/2024)  Transportation Needs: No Transportation Needs (04/08/2024)  Utilities: Not At Risk (04/08/2024)  Alcohol Screen: Low Risk  (10/01/2023)  Depression (PHQ2-9): Low Risk  (03/31/2024)  Financial Resource Strain: Low Risk  (08/25/2023)  Physical Activity: Insufficiently Active (08/25/2023)  Social Connections: Patient Declined (04/08/2024)  Stress: No Stress Concern Present (08/25/2023)  Tobacco Use: Low Risk  (04/08/2024)  Health Literacy: Adequate Health Literacy (10/01/2023)   SDOH Interventions:     Readmission Risk Interventions     No data to display

## 2024-04-14 ENCOUNTER — Encounter: Payer: Self-pay | Admitting: Neurology

## 2024-04-14 ENCOUNTER — Ambulatory Visit: Admitting: Neurology

## 2024-04-14 VITALS — BP 131/86 | HR 78 | Ht 62.0 in | Wt 286.0 lb

## 2024-04-14 DIAGNOSIS — G40909 Epilepsy, unspecified, not intractable, without status epilepticus: Secondary | ICD-10-CM

## 2024-04-14 NOTE — Progress Notes (Signed)
 Chief Complaint  Patient presents with   Seizures    Rm 15 alone  Pt is well, reports she was informed by her son she became non responsive, hand shakes and zone out. Symptoms last for about two minutes. She had a similar episode last year. She also mentions she has woken up in the past with a sore tongue from biting it and questions if she has had seizures in the past.       ASSESSMENT AND PLAN  Jennifer Mcbride is a 68 y.o. female   Seizure-like activity on April 08, 2024  Abnormal EEG MRI of the brain showed no significant abnormality  Was started on Keppra  500 mg twice a day  Repeat EEG, if nonrevealing, may consider 72 hours ambulatory EEG,  No driving until seizure-free for 6 months  Return to clinic in 6 months   DIAGNOSTIC DATA (LABS, IMAGING, TESTING) - I reviewed patient records, labs, notes, testing and imaging myself where available.   MEDICAL HISTORY:  Jennifer Mcbride, is a 68 year old female, accompanied by her son Arvel seen in request by his primary care NP  Gareth Clarity to follow-up on seizure,  History is obtained from the patient and review of electronic medical records. I personally reviewed pertinent available imaging films in PACS.   PMHx of  HLD OSA  She lives at home with her son and mother, retired, denies a history of seizure,  On April 08, 2024, while talking with her son at 90 AM by the dining table, she suddenly stop in the middle of the sentence, put her hands into her mouth, body shaking, most twisted, denies tongue biting, loss control of urine, last for few minutes, she has no recollection of the event, son called ambulance, when she came to, rescue squad was surrounding her, was admitted to the hospital  MRI of the brain with without contrast showed no significant abnormality  EEG showed left temporal sharp wave, potential epileptogenic activity by Dr. Michaela, was discharged with Keppra  500 twice a day, tolerating it  well  Laboratory showed normal B12 vitamin D , magnesium, phosphate, BMP CPK TSH A1c, mild anemia hemoglobin of 11.6  PHYSICAL EXAM:   Vitals:   04/14/24 0910  BP: 131/86  Pulse: 78  Weight: 286 lb (129.7 kg)  Height: 5' 2 (1.575 m)   Body mass index is 52.31 kg/m.  PHYSICAL EXAMNIATION:  Gen: NAD, conversant, well nourised, well groomed                     Cardiovascular: Regular rate rhythm, no peripheral edema, warm, nontender. Eyes: Conjunctivae clear without exudates or hemorrhage Neck: Supple, no carotid bruits. Pulmonary: Clear to auscultation bilaterally   NEUROLOGICAL EXAM:  MENTAL STATUS: Speech/cognition: Obese, awake, alert, oriented to history taking and casual conversation CRANIAL NERVES: CN II: Visual fields are full to confrontation. Pupils are round equal and briskly reactive to light. CN III, IV, VI: extraocular movement are normal. No ptosis. CN V: Facial sensation is intact to light touch CN VII: Face is symmetric with normal eye closure  CN VIII: Hearing is normal to causal conversation. CN IX, X: Phonation is normal. CN XI: Head turning and shoulder shrug are intact  MOTOR: There is no pronator drift of out-stretched arms. Muscle bulk and tone are normal. Muscle strength is normal.  REFLEXES: Reflexes are hypoactive and symmetric at the biceps, triceps, knees, and ankles. Plantar responses are flexor.  SENSORY: Intact to light touch, pinprick and  vibratory sensation are intact in fingers and toes.  COORDINATION: There is no trunk or limb dysmetria noted.  GAIT/STANCE: Push-up to get up from seated position, limited by her big body habitus  REVIEW OF SYSTEMS:  Full 14 system review of systems performed and notable only for as above All other review of systems were negative.   ALLERGIES: No Known Allergies  HOME MEDICATIONS: Current Outpatient Medications  Medication Sig Dispense Refill   atorvastatin  (LIPITOR) 40 MG tablet Take 0.5  tablets (20 mg total) by mouth at bedtime. 30 tablet 5   levETIRAcetam  (KEPPRA ) 500 MG tablet Take 1 tablet (500 mg total) by mouth 2 (two) times daily. 180 tablet 3   Multiple Vitamin (MULTIVITAMIN) tablet Take 1 tablet by mouth daily.     No current facility-administered medications for this visit.    PAST MEDICAL HISTORY: Past Medical History:  Diagnosis Date   Hypertension    Iron deficiency anemia    IDA   Moderate mitral regurgitation    a. 12/2023 Echo: EF 60-65%, no rwma, nl RV size/fxn, mod MR, mild late systolic prolapse of both leaflets of MV.   Obesity    OSA (obstructive sleep apnea)    PSVT (paroxysmal supraventricular tachycardia) (HCC)    a. 08/2023 [facility]: Predominantly sinus rhythm, average 66 (41-167). 26 runs of SVT, longest 71 beats. Rare PACs/PVCs. No sustained arrhythmias or pauses.   Syncope    a. 08/2023 [facility]io: No sustained arrhythmias or pauses.   Urine incontinence    Vitamin D  deficiency     PAST SURGICAL HISTORY: Past Surgical History:  Procedure Laterality Date   COLONOSCOPY WITH PROPOFOL      COLONOSCOPY WITH PROPOFOL  N/A 03/28/2020   Procedure: COLONOSCOPY WITH PROPOFOL ;  Surgeon: Maryruth Ole DASEN, MD;  Location: [facility] ENDOSCOPY;  Service: Endoscopy;  Laterality: N/A;   TUBAL LIGATION      FAMILY HISTORY: Family History  Problem Relation Age of Onset   Hypertension Mother    Hearing loss Mother    Depression Mother    Arthritis Mother    Kidney disease Father    Hearing loss Father    Diabetes Father    Mental illness Sister    Early death Sister    Mental illness Sister    Kidney disease Sister    Early death Sister    Diabetes Sister    Mental illness Sister    Mental illness Brother    Arthritis Brother    Kidney disease Brother    Diabetes Brother    Kidney disease Brother    Diabetes Brother    Heart disease Son    Diabetes Son    Breast cancer Neg Hx     SOCIAL HISTORY: Social History   Socioeconomic History   Marital  status: Divorced    Spouse name: Not on file   Number of children: Not on file   Years of education: Not on file   Highest education level: Bachelor's degree (e.g., BA, AB, BS)  Occupational History   Not on file  Tobacco Use   Smoking status: Never   Smokeless tobacco: Never  Vaping Use   Vaping status: Never Used  Substance and Sexual Activity   Alcohol use: No   Drug use: Never   Sexual activity: Not Currently  Other Topics Concern   Not on file  Social History Narrative   Not on file   Social Drivers of Health   Financial Resource Strain: Low Risk  (08/25/2023)  Overall Financial Resource Strain (CARDIA)    Difficulty of Paying Living Expenses: Not hard at all  Food Insecurity: No Food Insecurity (04/08/2024)   Hunger Vital Sign    Worried About Running Out of Food in the Last Year: Never true    Ran Out of Food in the Last Year: Never true  Transportation Needs: No Transportation Needs (04/08/2024)   PRAPARE - Administrator, Civil Service (Medical): No    Lack of Transportation (Non-Medical): No  Physical Activity: Insufficiently Active (08/25/2023)   Exercise Vital Sign    Days of Exercise per Week: 3 days    Minutes of Exercise per Session: 10 min  Stress: No Stress Concern Present (08/25/2023)   [employer] of Occupational Health - Occupational Stress Questionnaire    Feeling of Stress : Only a little  Social Connections: Patient Declined (04/08/2024)   Social Connection and Isolation Panel    Frequency of Communication with Friends and Family: Patient declined    Frequency of Social Gatherings with Friends and Family: Patient declined    Attends Religious Services: Patient declined    Database administrator or Organizations: Patient declined    Attends Banker Meetings: Patient declined    Marital Status: Patient declined  Intimate Partner Violence: Not At Risk (04/08/2024)   Humiliation, Afraid, Rape, and Kick questionnaire    Fear of  Current or Ex-Partner: No    Emotionally Abused: No    Physically Abused: No    Sexually Abused: No      Modena Callander, M.D. Ph.D.  [facility] [address], [address] [place], [place] [address] Ph: [phone] Fax: [phone]  CC:  V[place], MD [address] [place],  [place] [address]  Gareth Mliss FALCON, FNP

## 2024-04-15 ENCOUNTER — Ambulatory Visit (INDEPENDENT_AMBULATORY_CARE_PROVIDER_SITE_OTHER): Admitting: Nurse Practitioner

## 2024-04-15 VITALS — BP 138/82 | HR 98 | Temp 97.7°F | Resp 18 | Ht 62.0 in | Wt 285.6 lb

## 2024-04-15 DIAGNOSIS — D649 Anemia, unspecified: Secondary | ICD-10-CM | POA: Diagnosis not present

## 2024-04-15 NOTE — Progress Notes (Addendum)
 BP 138/82   Pulse 98   Temp 97.7 F (36.5 C)   Resp 18   Ht 5' 2 (1.575 m)   Wt 285 lb 9.6 oz (129.5 kg)   SpO2 97%   BMI 52.24 kg/m    Subjective:    Patient ID: Jennifer Mcbride, female    DOB: 12-Mar-1956, 68 y.o.   MRN: [id number]  HPI: Jennifer Mcbride is a 68 y.o. female  Chief Complaint  Patient presents with   Hospitalization Follow-up   Discussed the use of AI scribe software for clinical note transcription with the patient, who gave verbal consent to proceed.  History of Present Illness Jennifer Mcbride is a 68 year old female who presents for a hospital follow-up after new onset seizures.  Seizure activity and altered consciousness - Admitted to hospital on April 08, 2024, for new onset seizures and stroke-like symptoms; discharged on April 09, 2024 - Onset of event while talking to her son at the dining room table: sudden cessation of speech, placed hands in mouth, followed by generalized shaking - No tongue biting or urinary incontinence during the event - Loss of consciousness for several minutes with no recollection of the episode - No further seizures since the initial event - Similar prior episode occurred while getting her hair done, with loss of consciousness and awakening to presence of firefighters; informed by others that fatigue from working third shift may have contributed - Family history significant for a sister with seizures  Antiepileptic therapy - Started on Keppra  during hospitalization - Currently taking Keppra  with adequate supply - No recurrence of seizures since initiation of Keppra  - Neurology follow-up on April 14, 2024, with plan for repeat EEG on May 07, 2024  Anemia and vitamin b12 deficiency - History of iron deficiency anemia, previously treated with iron supplementation (not currently taking iron) - Recent laboratory results show low B12 level at 444 and mild anemia - No blood in stool   Hospital  note:  Date of admission: 04/08/2024             Date of discharge:  04/09/2024       Discharge Diagnoses:  Principal Problem:   Seizures, transient (HCC) Active Problems:   Stroke-like symptoms     Admitted From: Home Disposition:  Home    Recommendations for Outpatient Follow-up:  Follow-up with PCP in 1 week, repeat lipid profile after 6 months follow-up with neurology in 1 week.  No driving for 6 months/until cleared by neurology      Diet recommendation: Cardiac diet   Activity: The patient is advised to gradually reintroduce usual activities, as tolerated   Discharge Condition: stable   Code Status: Full code    History of present illness: As per the H and P dictated on admission.   Hospital Course:  Jennifer Mcbride is a 68 y.o. female with Past medical history of HTN, HLD, mitral valve prolapse, OSA, obesity on CPAP has recovered remarkable presented with an episode of starring and twitching. As per pt's son she was sitting on the dinning table and talking, suddenly she mentally left the conversation and kept one hand on the mouth and soon started twitching for < 1 min, noticed right side facial drift which resolved. Pt was not aware of this episode, no provoking symptoms and after the episode she fel foggy mind. No any focal deficits.  Currently she is back to her baseline.  Denies any headache or dizziness.  No oriented  complaints.   ED Course: VS afebrile, HR 63, RR 18, BP 123/77, 98% on RA BMP: CO2 21, glucose 103 rest within normal range CK 72.  Troponin negative CBC wnl UA negative CT Head: No acute intracranial abnormality.     Assessment/Plan    # Seizure disorder, new onset Continue seizure precautions. No episodes during hospital stay MRI brain:  No acute intracranial abnormality. No mass or abnormal enhancement. Small polypoidal mucosal density within the left maxillary sinus. EEG: This EEG is consistent with an area of potential pileptogenicity in the  left temporal region. There was no seizure recorded on this study.  Consulted Neurology, recommended Keppra  500 mg po BID and f/u as an out patient. No driving for 6 months and until cleared by Neuro   # HTN, HLD She is not on any medications at home LDL 109 slightly high, started Lipitor 20 mg pod BP remained stable     # OSA, on cpap   Body mass index is 52.34 kg/m.  Nutrition Interventions:   Patient is unable to drive, operate heavy machinery, perform activities at heights or participate in water activities until release by outpatient physician. This was discussed with the patient who expressed understanding.      - Patient was instructed, not to drive, operate heavy machinery, perform activities at heights, swimming or participation in water activities or provide baby sitting services while on Pain, Sleep and Anxiety Medications; until her outpatient Physician has advised to do so again.  - Also recommended to not to take more than prescribed Pain, Sleep and Anxiety Medications.    Med Rec  TAKE these medications     atorvastatin  40 MG tablet Commonly known as: LIPITOR Take 0.5 tablets (20 mg total) by mouth at bedtime.    levETIRAcetam  500 MG tablet Commonly known as: KEPPRA  Take 1 tablet (500 mg total) by mouth 2 (two) times daily.    multivitamin tablet Take 1 tablet by mouth daily.           Allergies  No Known Allergies   Discharge Instructions       Ambulatory referral to Neurology   Complete by: As directed      An appointment is requested in approximately: 1 week    Call MD for:   Complete by: As directed      Recurrent similar episodes or seizures    Call MD for:  difficulty breathing, headache or visual disturbances   Complete by: As directed      Call MD for:  extreme fatigue   Complete by: As directed      Call MD for:  persistant dizziness or light-headedness   Complete by: As directed      Call MD for:  persistant nausea and vomiting    Complete by: As directed      Call MD for:  severe uncontrolled pain   Complete by: As directed      Diet general   Complete by: As directed      Discharge instructions   Complete by: As directed      Follow-up with PCP in 1 week, repeat lipid profile after 6 months follow-up with neurology in 1 week.  No driving for 6 months/until cleared by neurology    Increase activity slowly   Complete by: As directed        MRI BRAIN WITH AND WITHOUT CONTRAST 04/09/2024 03:28:58 AM   TECHNIQUE: Multiplanar multisequence MRI of the head/brain was performed with and without  the administration of intravenous contrast.   COMPARISON: CT of the head dated 04/08/2024.   CLINICAL HISTORY: Seizure, new-onset, no history of trauma.   FINDINGS:   BRAIN AND VENTRICLES: No acute infarct. No acute intracranial hemorrhage. No mass effect or midline shift. No hydrocephalus. The sella is unremarkable. Normal flow voids. No mass or abnormal enhancement. There is no evidence of mesial temporal sclerosis, cortical dysplasia or heterotopia. There are no findings to explain seizure disorder.   ORBITS: No acute abnormality.   SINUSES: There is a small polypoidal mucosal density within the left maxillary sinus.   BONES AND SOFT TISSUES: Normal bone marrow signal and enhancement. No acute soft tissue abnormality.   IMPRESSION: 1. No acute intracranial abnormality. 2. No mass or abnormal enhancement. 3. Small polypoidal mucosal density within the left maxillary sinus.  EXAM: CT HEAD WITHOUT CONTRAST   TECHNIQUE: Contiguous axial images were obtained from the base of the skull through the vertex without intravenous contrast.   RADIATION DOSE REDUCTION: This exam was performed according to the departmental dose-optimization program which includes automated exposure control, adjustment of the mA and/or kV according to patient size and/or use of iterative reconstruction technique.    COMPARISON:  None Available.   FINDINGS: Brain: No acute intracranial abnormality. Specifically, no hemorrhage, hydrocephalus, mass lesion, acute infarction, or significant intracranial injury.   Vascular: No hyperdense vessel or unexpected calcification.   Skull: No acute calvarial abnormality.   Sinuses/Orbits: No acute findings   Other: None   IMPRESSION: No acute intracranial abnormality.       03/31/2024    8:19 AM 10/01/2023    8:13 AM 08/26/2023    9:35 AM  Depression screen PHQ 2/9  Decreased Interest 0 0 0  Down, Depressed, Hopeless 0 0 0  PHQ - 2 Score 0 0 0  Altered sleeping 0 0   Tired, decreased energy 0 0   Change in appetite 0 0   Feeling bad or failure about yourself  0 0   Trouble concentrating 0 0   Moving slowly or fidgety/restless 0 0   Suicidal thoughts 0 0   PHQ-9 Score 0 0   Difficult doing work/chores Not difficult at all Not difficult at all     Relevant past medical, surgical, family and social history reviewed and updated as indicated. Interim medical history since our last visit reviewed. Allergies and medications reviewed and updated.  Review of Systems  Constitutional: Negative for fever or weight change.  Respiratory: Negative for cough and shortness of breath.   Cardiovascular: Negative for chest pain or palpitations.  Gastrointestinal: Negative for abdominal pain, no bowel changes.  Musculoskeletal: Negative for gait problem or joint swelling.  Skin: Negative for rash.  Neurological: Negative for dizziness or headache.  No other specific complaints in a complete review of systems (except as listed in HPI above).      Objective:      BP 138/82   Pulse 98   Temp 97.7 F (36.5 C)   Resp 18   Ht 5' 2 (1.575 m)   Wt 285 lb 9.6 oz (129.5 kg)   SpO2 97%   BMI 52.24 kg/m    Wt Readings from Last 3 Encounters:  04/15/24 285 lb 9.6 oz (129.5 kg)  04/14/24 286 lb (129.7 kg)  04/08/24 277 lb (125.6 kg)    Physical  Exam VITALS: P- 114, BP- 138/82 GENERAL: Alert, cooperative, well developed, no acute distress HEENT: Normocephalic, normal oropharynx, moist mucous membranes CHEST: Clear  to auscultation bilaterally, No wheezes, rhonchi, or crackles CARDIOVASCULAR: Normal heart rate and rhythm, S1 and S2 normal without murmurs, Heart sounds normal ABDOMEN: Soft, non-tender, non-distended, without organomegaly, Normal bowel sounds EXTREMITIES: No cyanosis or edema NEUROLOGICAL: Cranial nerves grossly intact, Moves all extremities without gross motor or sensory deficit  Results for orders placed or performed during the hospital encounter of 04/08/24  Comprehensive metabolic panel   Collection Time: 04/08/24 12:48 PM  Result Value Ref Range   Sodium 141 135 - 145 mmol/L   Potassium 3.7 3.5 - 5.1 mmol/L   Chloride 109 98 - 111 mmol/L   CO2 21 (L) 22 - 32 mmol/L   Glucose, Bld 103 (H) 70 - 99 mg/dL   BUN 12 8 - 23 mg/dL   Creatinine, Ser 9.21 0.44 - 1.00 mg/dL   Calcium  9.7 8.9 - 10.3 mg/dL   Total Protein 7.9 6.5 - 8.1 g/dL   Albumin 4.0 3.5 - 5.0 g/dL   AST 17 15 - 41 U/L   ALT 15 0 - 44 U/L   Alkaline Phosphatase 72 38 - 126 U/L   Total Bilirubin 1.0 0.0 - 1.2 mg/dL   GFR, Estimated >39 >39 mL/min   Anion gap 11 5 - 15  CBC   Collection Time: 04/08/24 12:48 PM  Result Value Ref Range   WBC 6.7 4.0 - 10.5 K/uL   RBC 4.03 3.87 - 5.11 MIL/uL   Hemoglobin 12.0 12.0 - 15.0 g/dL   HCT 63.1 63.9 - 53.9 %   MCV 91.3 80.0 - 100.0 fL   MCH 29.8 26.0 - 34.0 pg   MCHC 32.6 30.0 - 36.0 g/dL   RDW 87.1 88.4 - 84.4 %   Platelets 267 150 - 400 K/uL   nRBC 0.0 0.0 - 0.2 %  CK   Collection Time: 04/08/24 12:48 PM  Result Value Ref Range   Total CK 72 38 - 234 U/L  Troponin I (High Sensitivity)   Collection Time: 04/08/24 12:48 PM  Result Value Ref Range   Troponin I (High Sensitivity) 3 <18 ng/L  CBG monitoring, ED   Collection Time: 04/08/24  2:29 PM  Result Value Ref Range   Glucose-Capillary 83  70 - 99 mg/dL  Urinalysis, Routine w reflex microscopic -Urine, Clean Catch   Collection Time: 04/08/24  2:34 PM  Result Value Ref Range   Color, Urine YELLOW (A) YELLOW   APPearance HAZY (A) CLEAR   Specific Gravity, Urine 1.011 1.005 - 1.030   pH 5.0 5.0 - 8.0   Glucose, UA NEGATIVE NEGATIVE mg/dL   Hgb urine dipstick NEGATIVE NEGATIVE   Bilirubin Urine NEGATIVE NEGATIVE   Ketones, ur NEGATIVE NEGATIVE mg/dL   Protein, ur NEGATIVE NEGATIVE mg/dL   Nitrite NEGATIVE NEGATIVE   Leukocytes,Ua NEGATIVE NEGATIVE  Troponin I (High Sensitivity)   Collection Time: 04/08/24  4:09 PM  Result Value Ref Range   Troponin I (High Sensitivity) 2 <18 ng/L  HIV Antibody (routine testing w rflx)   Collection Time: 04/09/24  6:14 AM  Result Value Ref Range   HIV Screen 4th Generation wRfx Non Reactive Non Reactive  CBC   Collection Time: 04/09/24  6:14 AM  Result Value Ref Range   WBC 5.8 4.0 - 10.5 K/uL   RBC 3.86 (L) 3.87 - 5.11 MIL/uL   Hemoglobin 11.6 (L) 12.0 - 15.0 g/dL   HCT 64.7 (L) 63.9 - 53.9 %   MCV 91.2 80.0 - 100.0 fL   MCH 30.1 26.0 -  34.0 pg   MCHC 33.0 30.0 - 36.0 g/dL   RDW 87.1 88.4 - 84.4 %   Platelets 247 150 - 400 K/uL   nRBC 0.0 0.0 - 0.2 %  Basic metabolic panel with GFR   Collection Time: 04/09/24  6:14 AM  Result Value Ref Range   Sodium 140 135 - 145 mmol/L   Potassium 4.3 3.5 - 5.1 mmol/L   Chloride 107 98 - 111 mmol/L   CO2 25 22 - 32 mmol/L   Glucose, Bld 87 70 - 99 mg/dL   BUN 13 8 - 23 mg/dL   Creatinine, Ser 9.28 0.44 - 1.00 mg/dL   Calcium  9.2 8.9 - 10.3 mg/dL   GFR, Estimated >39 >39 mL/min   Anion gap 8 5 - 15  Phosphorus   Collection Time: 04/09/24  6:14 AM  Result Value Ref Range   Phosphorus 3.2 2.5 - 4.6 mg/dL  Magnesium   Collection Time: 04/09/24  6:14 AM  Result Value Ref Range   Magnesium 2.0 1.7 - 2.4 mg/dL  Vitamin B12   Collection Time: 04/09/24  6:14 AM  Result Value Ref Range   Vitamin B-12 444 180 - 914 pg/mL  VITAMIN D  25  Hydroxy (Vit-D Deficiency, Fractures)   Collection Time: 04/09/24  6:14 AM  Result Value Ref Range   Vit D, 25-Hydroxy 36.52 30 - 100 ng/mL          Assessment & Plan:   Problem List Items Addressed This Visit   None Visit Diagnoses       Anemia, unspecified type    -  Primary   Relevant Orders   CBC with Differential/Platelet   Iron, TIBC and Ferritin Panel        Assessment and Plan Assessment & Plan Seizure disorder, hospital follow up New onset seizure disorder with an episode on September 3rd characterized by sudden cessation of speech, hand movements to mouth, and body shaking. No recollection of the event. EEG showed left temporal sharp wave potentially epileptogenic activity. MRI and CT head were normal. Neurology follow-up completed, and another EEG is scheduled for October 2nd. Currently on Keppra  twice daily. No further seizures reported. Advised not to drive unless seizure-free for six months. - Continue Keppra  twice daily - Ensure follow-up with neurology in six months - Schedule EEG on October 2nd - Advise no driving unless seizure-free for six months  Anemia Mild anemia with low B12 levels. No blood in stool reported. Iron deficiency anemia in the past, but no current iron supplementation. - Order CBC and iron panel in one month - Monitor for any signs of blood in stool or abnormal bleeding - Instruct to report any signs of bleeding immediately        Follow up plan: Return if symptoms worsen or fail to improve.

## 2024-04-16 ENCOUNTER — Ambulatory Visit: Admitting: Nurse Practitioner

## 2024-04-17 NOTE — Addendum Note (Signed)
 Addended by: Petrice Beedy F on: 04/17/2024 07:37 AM   Modules accepted: Level of Service

## 2024-04-21 ENCOUNTER — Encounter (INDEPENDENT_AMBULATORY_CARE_PROVIDER_SITE_OTHER): Payer: Self-pay

## 2024-04-28 ENCOUNTER — Ambulatory Visit: Payer: Self-pay

## 2024-05-01 ENCOUNTER — Encounter: Payer: Self-pay | Admitting: Nurse Practitioner

## 2024-05-04 ENCOUNTER — Encounter: Payer: Self-pay | Admitting: Nurse Practitioner

## 2024-05-07 ENCOUNTER — Ambulatory Visit: Payer: Self-pay | Admitting: Neurology

## 2024-05-07 ENCOUNTER — Ambulatory Visit: Admitting: Neurology

## 2024-05-07 ENCOUNTER — Telehealth: Payer: Self-pay | Admitting: *Deleted

## 2024-05-07 DIAGNOSIS — G40909 Epilepsy, unspecified, not intractable, without status epilepticus: Secondary | ICD-10-CM

## 2024-05-07 DIAGNOSIS — R410 Disorientation, unspecified: Secondary | ICD-10-CM | POA: Insufficient documentation

## 2024-05-07 NOTE — Addendum Note (Signed)
 Addended by: JOSHUA IZETTA CROME on: 05/07/2024 03:24 PM   Modules accepted: Orders

## 2024-05-07 NOTE — Procedures (Signed)
   HISTORY: 68 year old female with confusion spells,  TECHNIQUE:  This is a routine 16 channel EEG recording with one channel devoted to a limited EKG recording.  It was performed during wakefulness, drowsiness and asleep.  Hyperventilation and photic stimulation were performed as activating procedures.  There are minimum muscle and movement artifact noted.  Upon maximum arousal, posterior dominant waking rhythm consistent of rhythmic alpha range activity. Activities are symmetric over the bilateral posterior derivations and attenuated with eye opening.  Photic stimulation did not alter the tracing.  Hyperventilation produced mild/moderate buildup with higher amplitude and the slower activities noted.  During EEG recording, patient developed drowsiness and no deeper stage of sleep was achieved  During EEG recording, there was no epileptiform discharge noted.  EKG demonstrate normal sinus rhythm.  CONCLUSION: This is a  normal awake EEG.  There is no electrodiagnostic evidence of epileptiform discharge.  Tashanna Dolin, M.D. Ph.D.  [facility] [address] [place], [place] [address] Phone: [phone] Fax:      [phone]

## 2024-05-07 NOTE — Telephone Encounter (Signed)
 Call to patient, she verbalized understanding of results and agreeable to decrease Keppra  to 250 mg twice daily as advised by Dr. Onita note. She is agreeable to ambulatory EEG and advise med list will be updated but new script not sent in until current supply out.

## 2024-05-07 NOTE — Telephone Encounter (Signed)
  Please call patient, EEG from May 07, 2024 was normal, It is okay to decrease her Keppra  from 500 mg twice a day to 250 mg twice a day  I also ordered a 72 hours ambulatory EEG for further evaluation,  Orders Placed This Encounter  Procedures   AMBULATORY EEG

## 2024-05-11 ENCOUNTER — Other Ambulatory Visit

## 2024-05-11 ENCOUNTER — Encounter

## 2024-05-18 ENCOUNTER — Encounter: Payer: Self-pay | Admitting: Nurse Practitioner

## 2024-05-18 DIAGNOSIS — G4733 Obstructive sleep apnea (adult) (pediatric): Secondary | ICD-10-CM

## 2024-05-20 NOTE — Telephone Encounter (Signed)
 [facility] called to get update on this request.

## 2024-05-25 LAB — CBC WITH DIFFERENTIAL/PLATELET
Absolute Lymphocytes: 1540 {cells}/uL (ref 850–3900)
Absolute Monocytes: 549 {cells}/uL (ref 200–950)
Basophils Absolute: 67 {cells}/uL (ref 0–200)
Basophils Relative: 1.2 %
Eosinophils Absolute: 179 {cells}/uL (ref 15–500)
Eosinophils Relative: 3.2 %
HCT: 36 % (ref 35.0–45.0)
Hemoglobin: 11.6 g/dL — ABNORMAL LOW (ref 11.7–15.5)
MCH: 29.3 pg (ref 27.0–33.0)
MCHC: 32.2 g/dL (ref 32.0–36.0)
MCV: 90.9 fL (ref 80.0–100.0)
MPV: 10.5 fL (ref 7.5–12.5)
Monocytes Relative: 9.8 %
Neutro Abs: 3265 {cells}/uL (ref 1500–7800)
Neutrophils Relative %: 58.3 %
Platelets: 286 Thousand/uL (ref 140–400)
RBC: 3.96 Million/uL (ref 3.80–5.10)
RDW: 12.8 % (ref 11.0–15.0)
Total Lymphocyte: 27.5 %
WBC: 5.6 Thousand/uL (ref 3.8–10.8)

## 2024-05-25 LAB — IRON,TIBC AND FERRITIN PANEL
%SAT: 35 % (ref 16–45)
Ferritin: 153 ng/mL (ref 16–288)
Iron: 99 ug/dL (ref 45–160)
TIBC: 286 ug/dL (ref 250–450)

## 2024-05-26 ENCOUNTER — Ambulatory Visit: Payer: Self-pay | Admitting: Nurse Practitioner

## 2024-05-28 DIAGNOSIS — R569 Unspecified convulsions: Secondary | ICD-10-CM

## 2024-05-28 DIAGNOSIS — R410 Disorientation, unspecified: Secondary | ICD-10-CM

## 2024-06-04 ENCOUNTER — Other Ambulatory Visit

## 2024-06-04 ENCOUNTER — Encounter

## 2024-06-09 ENCOUNTER — Ambulatory Visit
Admission: RE | Admit: 2024-06-09 | Discharge: 2024-06-09 | Disposition: A | Discharge status: Home / Self Care | Source: Ambulatory Visit | Attending: Nurse Practitioner | Admitting: Nurse Practitioner

## 2024-06-09 ENCOUNTER — Ambulatory Visit: Payer: Self-pay | Admitting: Nurse Practitioner

## 2024-06-09 ENCOUNTER — Ambulatory Visit
Admission: RE | Admit: 2024-06-09 | Discharge: 2024-06-09 | Disposition: A | Source: Ambulatory Visit | Attending: Nurse Practitioner

## 2024-06-09 DIAGNOSIS — Z1231 Encounter for screening mammogram for malignant neoplasm of breast: Secondary | ICD-10-CM | POA: Diagnosis present

## 2024-06-09 DIAGNOSIS — Z780 Asymptomatic menopausal state: Secondary | ICD-10-CM | POA: Insufficient documentation

## 2024-06-09 DIAGNOSIS — Z13820 Encounter for screening for osteoporosis: Secondary | ICD-10-CM | POA: Insufficient documentation

## 2024-06-10 ENCOUNTER — Ambulatory Visit: Payer: Self-pay | Admitting: Neurology

## 2024-06-10 ENCOUNTER — Encounter (INDEPENDENT_AMBULATORY_CARE_PROVIDER_SITE_OTHER): Payer: Self-pay | Admitting: Neurology

## 2024-06-10 DIAGNOSIS — R410 Disorientation, unspecified: Secondary | ICD-10-CM

## 2024-06-10 NOTE — Procedures (Signed)
 Patient Name: Jennifer Mcbride  MRN: [id number]  Referring Physician/Provider: Onita Study start date: 05/25/2024 at 1159 AM Study end date: 05/28/2024 at 0814 AM Duration: 69 hours    Clinical History:   67Y Female reports she was informed by her son she became non-responsive, hand shaking, and zoning out. Symptoms last about 2 minutes. Similar episodes previous year accompanied with sore tongue from biting it during sleep. Previous EEG showed left temporal sharp waves. PMH of Hypertension, IDA, Moderate mitral regurgitation, obesity, OSA, PVST, syncope, Vitamin D  deficiency, and urinary incontinence.   INTERMITTENT MONITORING with VIDEO TECHNICAL SUMMARY:  This AVEEG was performed using equipment provided by [employer] utilizing Bluetooth ( [employer] ) amplifiers with continuous EEGT attended video collection using encrypted remote transmission via [employer] secured cellular tower network with data rates for each AVEEG performed. This is a therapist, music AVEEG, obtained, according to the 10-20 international electrode placement system, reformatted digitally into referential and bipolar montages. Data was acquired with a minimum of 21 bipolar connections and sampled at a minimum rate of 250 cycles per second per channel, maximum rate of 450 cycles per second per channel and two channels for EKG. The entire VEEG study was recorded through cable and or radio telemetry for subsequent analysis. Specified epochs of the AVEEG data were identified at the direction of the subject by the depression of a push button by the patient. Each patients event file included data acquired two minutes prior to the push button activation and continuing until two minutes afterwards. AVEEG files were reviewed on [employer] server, Licensed Software provided by [employer] with a digital high frequency filter set at 70 Hz and a low frequency filter set at 1 Hz with a paper speed of 64mm/s resulting in  10 seconds per digital page. This entire AVEEG was reviewed by the EEG Technologist. Random time samples, random sleep samples, clips, patient initiated push button files with included patient daily diary logs, EEG Technologist pruned data was reviewed and verified for accuracy and validity by the governing reading neurologist in full details. This AEEGV was fully compliant with all requirements for CPT 97500 for setup, patient education, take down and administered by an EEG technologist.   Long-Term EEG with Video was monitored intermittently by a qualified EEG technologist for the entirety of the recording; quality check-ins were performed at a minimum of every two hours, checking and documenting real-time data and video to assure the integrity and quality of the recording (e.g., camera position, electrode integrity and impedance), and identify the need for maintenance. For intermittent monitoring, an EEG Technologist monitored no more than 12 patients concurrently. Diagnostic video was captured at least 80% of the time during the recording.   PATIENT EVENTS:  A button press or notation was not made during this study.   TECHNOLOGIST EVENTS:  No clear epileptiform activity was detected by the reviewing neurodiagnostic technologist for further review.   TIME SAMPLES:  10-minutes of every two hours recorded are reviewed as random time samples.   SLEEP SAMPLES:  5-minutes of every 24 hour recorded sleep cycle are reviewed as random sleep samples.   AWAKE:  At maximal level of alertness, the posterior dominant background activity was continuous, reactive, low voltage rhythm of 9 Hz. This was symmetric, well-modulated, and attenuated with eye opening. Diffuse, symmetric, frontocentral beta range activity was present.   SLEEP:  N1 Sleep (Stage 1) was observed and characterized by the disappearance of alpha rhythm and the appearance of  vertex activity.   N2 Sleep (Stage 2) was observed and  characterized by vertex waves, K-complexes, and sleep spindles.   N3 (Stage 3) sleep was observed and characterized by high amplitude Delta activity of 20%.   REM sleep was observed.   EKG: There were no arrhythmias or abnormalities noted during this recording.   Impression:  Normal EEG: awake and asleep    Clinical Correlation:  This is a normal 3-day ambulatory EEG tracing. No focal abnormalities or epileptiform discharges were seen. There were no electrographic seizures noted. No events were captured during the recording. Please note a normal EEG does not exclude the diagnosis of epilepsy.   Devlin Brink, MD [facility]

## 2024-06-23 ENCOUNTER — Institutional Professional Consult (permissible substitution) (INDEPENDENT_AMBULATORY_CARE_PROVIDER_SITE_OTHER): Admitting: Nurse Practitioner

## 2024-08-13 ENCOUNTER — Ambulatory Visit (INDEPENDENT_AMBULATORY_CARE_PROVIDER_SITE_OTHER): Admitting: Nurse Practitioner

## 2024-08-13 VITALS — BP 118/78 | HR 55 | Temp 97.6°F | Ht 62.0 in | Wt 283.0 lb

## 2024-08-13 DIAGNOSIS — G4733 Obstructive sleep apnea (adult) (pediatric): Secondary | ICD-10-CM

## 2024-08-13 DIAGNOSIS — E7849 Other hyperlipidemia: Secondary | ICD-10-CM

## 2024-08-13 DIAGNOSIS — Z6841 Body Mass Index (BMI) 40.0 and over, adult: Secondary | ICD-10-CM | POA: Diagnosis not present

## 2024-08-13 DIAGNOSIS — Z131 Encounter for screening for diabetes mellitus: Secondary | ICD-10-CM

## 2024-08-13 DIAGNOSIS — G40909 Epilepsy, unspecified, not intractable, without status epilepticus: Secondary | ICD-10-CM

## 2024-08-13 DIAGNOSIS — I10 Essential (primary) hypertension: Secondary | ICD-10-CM | POA: Diagnosis not present

## 2024-08-13 MED ORDER — ATORVASTATIN CALCIUM 40 MG PO TABS: 20.0000 mg | ORAL_TABLET | Freq: Every day | ORAL | 1 refills | Status: AC

## 2024-08-13 NOTE — Progress Notes (Signed)
 "  BP 118/78   Pulse (!) 55   Temp 97.6 F (36.4 C)   Ht 5' 2 (1.575 m)   Wt 283 lb (128.4 kg)   SpO2 99%   BMI 51.76 kg/m    Subjective:    Patient ID: Jennifer Mcbride, female    DOB: [birth year], 69 y.o.   MRN: [id number]  HPI: Jennifer Mcbride is a 69 y.o. female  Chief Complaint  Patient presents with   Medical Management of Chronic Issues   Discussed the use of AI scribe software for clinical note transcription with the patient, who gave verbal consent to proceed.  History of Present Illness Jennifer Mcbride is a 69 year old female who presents for a four month follow-up.  Hyperlipidemia - Continues atorvastatin  20 mg daily - Last LDL was 109 mg/dL - Last YaJ8r was 4.7%  Seizure disorder - Takes Keppra  250 mg twice daily - No recent seizures - Feels improved - Neurology follow-up scheduled in March for further evaluation and testing related to driving  Obstructive sleep apnea - No current issues related to sleep apnea  Morbid obesity - Weight is 283 pounds - BMI is 51.76 [employer] Visit from 08/13/2024 in [facility] Visit from 03/31/2024 in [facility]  1 57 inches 57 inches    - Discussed weight loss options - Encourage continuation of lifestyle modifications, including dietary management and regular exercise. -continue to increase physical activity, getting at least 150 min of physical activity a week.  Work on including runner, broadcasting/film/video 2 days a week.  - continue eating at a calorie deficit 1600-1700 cal a day, eating a well balanced diet with whole foods, avoiding processed foods.   Patient is motivated to continue working on lifestyle modification.    Hypertension and cardiac status - Followed by cardiology - No heart problems reported    BP Readings from Last 3 Encounters:  08/13/24 118/78  04/15/24 138/82  04/14/24 131/86         03/31/2024    8:19 AM 10/01/2023     8:13 AM 08/26/2023    9:35 AM  Depression screen PHQ 2/9  Decreased Interest 0 0 0  Down, Depressed, Hopeless 0 0 0  PHQ - 2 Score 0 0 0  Altered sleeping 0 0   Tired, decreased energy 0 0   Change in appetite 0 0   Feeling bad or failure about yourself  0 0   Trouble concentrating 0 0   Moving slowly or fidgety/restless 0 0   Suicidal thoughts 0 0   PHQ-9 Score 0  0    Difficult doing work/chores Not difficult at all Not difficult at all      Data saved with a previous flowsheet row definition    Relevant past medical, surgical, family and social history reviewed and updated as indicated. Interim medical history since our last visit reviewed. Allergies and medications reviewed and updated.  Review of Systems  Constitutional: Negative for fever or weight change.  Respiratory: Negative for cough and shortness of breath.   Cardiovascular: Negative for chest pain or palpitations.  Gastrointestinal: Negative for abdominal pain, no bowel changes.  Musculoskeletal: Negative for gait problem or joint swelling.  Skin: Negative for rash.  Neurological: Negative for dizziness or headache.  No other specific complaints in a complete review of systems (except as listed in HPI above).      Objective:  BP 118/78   Pulse (!) 55   Temp 97.6 F (36.4 C)   Ht 5' 2 (1.575 m)   Wt 283 lb (128.4 kg)   SpO2 99%   BMI 51.76 kg/m    Wt Readings from Last 3 Encounters:  08/13/24 283 lb (128.4 kg)  04/15/24 285 lb 9.6 oz (129.5 kg)  04/14/24 286 lb (129.7 kg)    Physical Exam VITALS: BP- 118/78 MEASUREMENTS: Weight- 283, BMI- 51.76. GENERAL: Alert, cooperative, well developed, no acute distress HEENT: Normocephalic, normal oropharynx, moist mucous membranes CHEST: Clear to auscultation bilaterally, No wheezes, rhonchi, or crackles CARDIOVASCULAR: Normal heart rate and rhythm, S1 and S2 normal without murmurs ABDOMEN: Soft, non-tender, non-distended, without organomegaly, Normal  bowel sounds EXTREMITIES: No cyanosis or edema NEUROLOGICAL: Cranial nerves grossly intact, Moves all extremities without gross motor or sensory deficit  Results for orders placed or performed in visit on 04/15/24  CBC with Differential/Platelet   Collection Time: 05/25/24  8:40 AM  Result Value Ref Range   WBC 5.6 3.8 - 10.8 Thousand/uL   RBC 3.96 3.80 - 5.10 Million/uL   Hemoglobin 11.6 (L) 11.7 - 15.5 g/dL   HCT 63.9 64.9 - 54.9 %   MCV 90.9 80.0 - 100.0 fL   MCH 29.3 27.0 - 33.0 pg   MCHC 32.2 32.0 - 36.0 g/dL   RDW 87.1 88.9 - 84.9 %   Platelets 286 140 - 400 Thousand/uL   MPV 10.5 7.5 - 12.5 fL   Neutro Abs 3,265 1,500 - 7,800 cells/uL   Absolute Lymphocytes 1,540 850 - 3,900 cells/uL   Absolute Monocytes 549 200 - 950 cells/uL   Eosinophils Absolute 179 15 - 500 cells/uL   Basophils Absolute 67 0 - 200 cells/uL   Neutrophils Relative % 58.3 %   Total Lymphocyte 27.5 %   Monocytes Relative 9.8 %   Eosinophils Relative 3.2 %   Basophils Relative 1.2 %  Iron, TIBC and Ferritin Panel   Collection Time: 05/25/24  8:40 AM  Result Value Ref Range   Iron 99 45 - 160 mcg/dL   TIBC 713 749 - 549 mcg/dL (calc)   %SAT 35 16 - 45 % (calc)   Ferritin 153 16 - 288 ng/mL          Assessment & Plan:   Problem List Items Addressed This Visit       Cardiovascular and Mediastinum   Hypertension - Primary   Relevant Medications   atorvastatin  (LIPITOR) 40 MG tablet   Other Relevant Orders   CBC with Differential/Platelet   Comprehensive metabolic panel with GFR     Respiratory   OSA (obstructive sleep apnea)     Nervous and Auditory   Seizure disorder (HCC)     Other   Morbid obesity with BMI of 50.0-59.9, adult (HCC)   Other hyperlipidemia   Relevant Medications   atorvastatin  (LIPITOR) 40 MG tablet   Other Relevant Orders   Comprehensive metabolic panel with GFR   Lipid panel   Other Visit Diagnoses       Screening for diabetes mellitus       Relevant  Orders   Comprehensive metabolic panel with GFR   Hemoglobin A1c        Assessment and Plan Assessment & Plan Morbid obesity BMI of 51.76. Discussed GLP-1 receptor agonists for weight loss, including Wegovy  and Zepbound. Wegovy  has been studied for cardiovascular, renal, and hepatic benefits, and potential Alzheimer's prevention. Zepbound may have less nausea due to dual  receptor action. Discussed side effects: nausea, vomiting, abdominal pain, constipation, fatigue, pancreatitis, and hair loss. Emphasized pancreatitis monitoring. Discussed oral Wegovy , pending insurance coverage. Cost considerations: oral Wegovy  $150/month for first two doses, $400/month thereafter; injection $400-500/month if not covered. - Will attempt to obtain oral Wegovy  next week. - If insurance does not cover oral Wegovy , will consider cost and alternative options. - Monitor for side effects, especially abdominal pain indicating pancreatitis.  Primary hypertension Blood pressure well-controlled at 118/78 mmHg.  Obstructive sleep apnea No issues reported.  Seizure disorder Well-managed with Keppra  250 mg twice daily. No recent seizures. Follow-up with neurology scheduled for March. - Contact neurology for Keppra  refill if needed before March.  Hyperlipidemia Managed with atorvastatin  20 mg daily. Last LDL was 109 mg/dL. Emphasized importance of continued medication to prevent cardiovascular events. - Continue atorvastatin  20 mg daily.        Follow up plan: Return in about 3 months (around 11/11/2024) for follow up. "

## 2024-08-14 ENCOUNTER — Ambulatory Visit: Payer: Self-pay | Admitting: Nurse Practitioner

## 2024-08-14 LAB — CBC WITH DIFFERENTIAL/PLATELET
Absolute Lymphocytes: 1459 {cells}/uL (ref 850–3900)
Absolute Monocytes: 593 {cells}/uL (ref 200–950)
Basophils Absolute: 51 {cells}/uL (ref 0–200)
Basophils Relative: 0.9 %
Eosinophils Absolute: 148 {cells}/uL (ref 15–500)
Eosinophils Relative: 2.6 %
HCT: 38.1 % (ref 35.9–46.0)
Hemoglobin: 12.3 g/dL (ref 11.7–15.5)
MCH: 29.4 pg (ref 27.0–33.0)
MCHC: 32.3 g/dL (ref 31.6–35.4)
MCV: 90.9 fL (ref 81.4–101.7)
MPV: 10.1 fL (ref 7.5–12.5)
Monocytes Relative: 10.4 %
Neutro Abs: 3449 {cells}/uL (ref 1500–7800)
Neutrophils Relative %: 60.5 %
Platelets: 302 Thousand/uL (ref 140–400)
RBC: 4.19 Million/uL (ref 3.80–5.10)
RDW: 12.7 % (ref 11.0–15.0)
Total Lymphocyte: 25.6 %
WBC: 5.7 Thousand/uL (ref 3.8–10.8)

## 2024-08-14 LAB — COMPREHENSIVE METABOLIC PANEL WITH GFR
AG Ratio: 1.4 (calc) (ref 1.0–2.5)
ALT: 10 U/L (ref 6–29)
AST: 15 U/L (ref 10–35)
Albumin: 4.2 g/dL (ref 3.6–5.1)
Alkaline phosphatase (APISO): 79 U/L (ref 37–153)
BUN: 8 mg/dL (ref 7–25)
CO2: 27 mmol/L (ref 20–32)
Calcium: 9.9 mg/dL (ref 8.6–10.4)
Chloride: 106 mmol/L (ref 98–110)
Creat: 0.67 mg/dL (ref 0.50–1.05)
Globulin: 2.9 g/dL (ref 1.9–3.7)
Glucose, Bld: 87 mg/dL (ref 65–99)
Potassium: 4.4 mmol/L (ref 3.5–5.3)
Sodium: 141 mmol/L (ref 135–146)
Total Bilirubin: 0.8 mg/dL (ref 0.2–1.2)
Total Protein: 7.1 g/dL (ref 6.1–8.1)
eGFR: 96 mL/min/1.73m2

## 2024-08-14 LAB — HEMOGLOBIN A1C
Hgb A1c MFr Bld: 5.1 %
Mean Plasma Glucose: 100 mg/dL
eAG (mmol/L): 5.5 mmol/L

## 2024-08-14 LAB — LIPID PANEL
Cholesterol: 191 mg/dL
HDL: 70 mg/dL
LDL Cholesterol (Calc): 103 mg/dL — ABNORMAL HIGH
Non-HDL Cholesterol (Calc): 121 mg/dL
Total CHOL/HDL Ratio: 2.7 (calc)
Triglycerides: 89 mg/dL

## 2024-08-18 ENCOUNTER — Encounter: Payer: Self-pay | Admitting: Nurse Practitioner

## 2024-08-19 ENCOUNTER — Other Ambulatory Visit: Payer: Self-pay | Admitting: Nurse Practitioner

## 2024-08-19 MED ORDER — SEMAGLUTIDE-WEIGHT MANAGEMENT 1.5 MG PO TABS: 1.5000 mg | ORAL_TABLET | Freq: Every day | ORAL | 0 refills | Status: AC

## 2024-11-02 ENCOUNTER — Ambulatory Visit: Admitting: Neurology

## 2024-12-14 ENCOUNTER — Ambulatory Visit: Admitting: Nurse Practitioner
